# Patient Record
Sex: Female | Born: 1980 | Race: White | Hispanic: Yes | Marital: Single | Smoking: Current every day smoker
Health system: Southern US, Community
[De-identification: ages and names within clinical notes are randomized; demographics above are authoritative.]

## PROBLEM LIST (undated history)

## (undated) HISTORY — PX: TUBAL LIGATION: SHX77

---

## 2007-05-28 ENCOUNTER — Emergency Department (HOSPITAL_COMMUNITY): Admission: EM | Admit: 2007-05-28 | Discharge: 2007-05-28 | Payer: Self-pay | Admitting: Emergency Medicine

## 2008-01-07 ENCOUNTER — Inpatient Hospital Stay (HOSPITAL_COMMUNITY): Admission: AD | Admit: 2008-01-07 | Discharge: 2008-01-07 | Payer: Self-pay | Admitting: Obstetrics & Gynecology

## 2008-02-22 ENCOUNTER — Inpatient Hospital Stay (HOSPITAL_COMMUNITY): Admission: AD | Admit: 2008-02-22 | Discharge: 2008-02-22 | Payer: Self-pay | Admitting: Obstetrics & Gynecology

## 2008-03-23 ENCOUNTER — Ambulatory Visit (HOSPITAL_COMMUNITY): Admission: RE | Admit: 2008-03-23 | Discharge: 2008-03-23 | Payer: Self-pay | Admitting: Family Medicine

## 2008-04-15 ENCOUNTER — Ambulatory Visit (HOSPITAL_COMMUNITY): Admission: RE | Admit: 2008-04-15 | Discharge: 2008-04-15 | Payer: Self-pay | Admitting: Family Medicine

## 2008-05-10 ENCOUNTER — Inpatient Hospital Stay (HOSPITAL_COMMUNITY): Admission: AD | Admit: 2008-05-10 | Discharge: 2008-05-10 | Payer: Self-pay | Admitting: Obstetrics and Gynecology

## 2008-05-10 ENCOUNTER — Ambulatory Visit: Payer: Self-pay | Admitting: Obstetrics and Gynecology

## 2008-05-14 ENCOUNTER — Ambulatory Visit (HOSPITAL_COMMUNITY): Admission: RE | Admit: 2008-05-14 | Discharge: 2008-05-14 | Payer: Self-pay | Admitting: Family Medicine

## 2008-06-11 ENCOUNTER — Ambulatory Visit (HOSPITAL_COMMUNITY): Admission: RE | Admit: 2008-06-11 | Discharge: 2008-06-11 | Payer: Self-pay | Admitting: Family Medicine

## 2008-07-09 ENCOUNTER — Ambulatory Visit (HOSPITAL_COMMUNITY): Admission: RE | Admit: 2008-07-09 | Discharge: 2008-07-09 | Payer: Self-pay | Admitting: Family Medicine

## 2008-07-13 ENCOUNTER — Inpatient Hospital Stay (HOSPITAL_COMMUNITY): Admission: AD | Admit: 2008-07-13 | Discharge: 2008-07-27 | Payer: Self-pay | Admitting: Obstetrics & Gynecology

## 2008-07-13 ENCOUNTER — Ambulatory Visit: Payer: Self-pay | Admitting: Family Medicine

## 2008-08-04 ENCOUNTER — Ambulatory Visit (HOSPITAL_COMMUNITY): Admission: RE | Admit: 2008-08-04 | Discharge: 2008-08-04 | Payer: Self-pay | Admitting: Obstetrics & Gynecology

## 2008-08-06 ENCOUNTER — Ambulatory Visit: Payer: Self-pay | Admitting: Obstetrics & Gynecology

## 2008-08-11 ENCOUNTER — Ambulatory Visit (HOSPITAL_COMMUNITY): Admission: RE | Admit: 2008-08-11 | Discharge: 2008-08-11 | Payer: Self-pay | Admitting: Obstetrics & Gynecology

## 2008-08-13 ENCOUNTER — Ambulatory Visit: Payer: Self-pay | Admitting: Obstetrics & Gynecology

## 2008-08-24 ENCOUNTER — Ambulatory Visit: Payer: Self-pay | Admitting: Obstetrics & Gynecology

## 2008-09-03 ENCOUNTER — Ambulatory Visit: Payer: Self-pay | Admitting: Obstetrics & Gynecology

## 2008-09-07 ENCOUNTER — Ambulatory Visit: Payer: Self-pay | Admitting: Obstetrics & Gynecology

## 2008-09-14 ENCOUNTER — Ambulatory Visit: Payer: Self-pay | Admitting: Obstetrics & Gynecology

## 2008-09-22 ENCOUNTER — Ambulatory Visit: Payer: Self-pay | Admitting: Obstetrics & Gynecology

## 2008-09-22 ENCOUNTER — Inpatient Hospital Stay (HOSPITAL_COMMUNITY): Admission: RE | Admit: 2008-09-22 | Discharge: 2008-09-25 | Payer: Self-pay | Admitting: Gynecology

## 2008-10-29 ENCOUNTER — Ambulatory Visit: Payer: Self-pay | Admitting: Family Medicine

## 2009-09-18 IMAGING — US US OB DETAIL+14 WK
1 series · 3 of 3 positions shown · non-contrast
Comparison: none

OBSTETRICAL ULTRASOUND:
 This ultrasound exam was performed in the [HOSPITAL] Ultrasound Department.  The OB US report was generated in the AS system, and faxed to the ordering physician.  This report is also available in [REDACTED] PACS.

[Series 1: us ob detail+14 wk · 0.11mm/px · 3 of 3 slices shown]
[im 1/3]
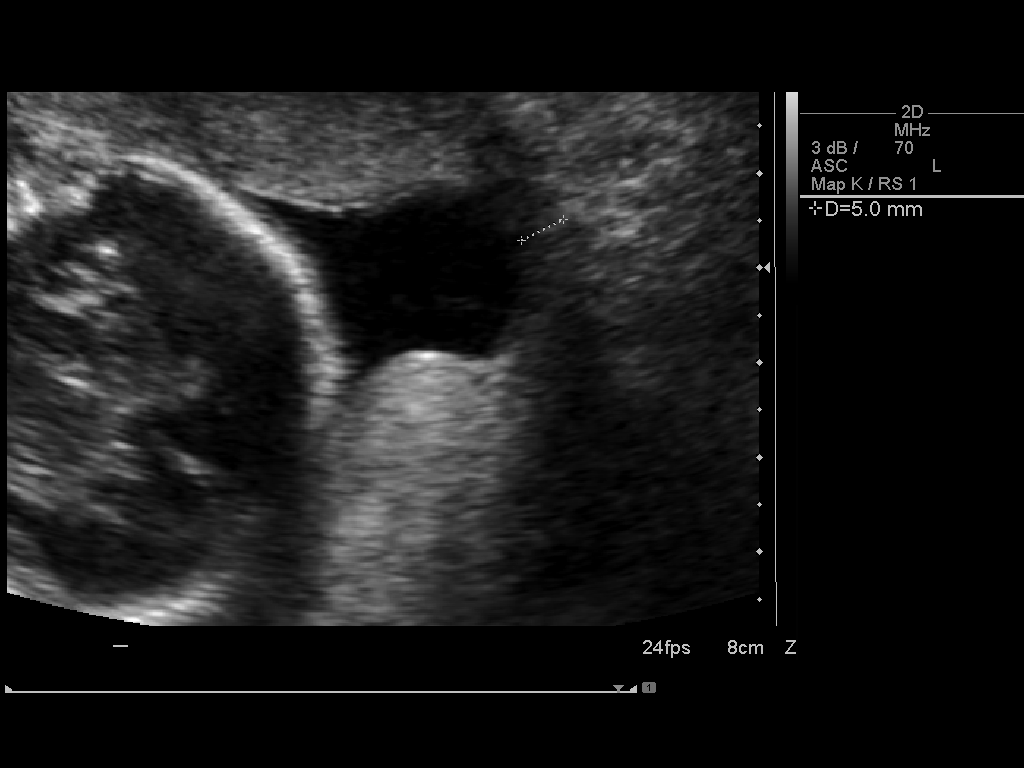
[im 2/3]
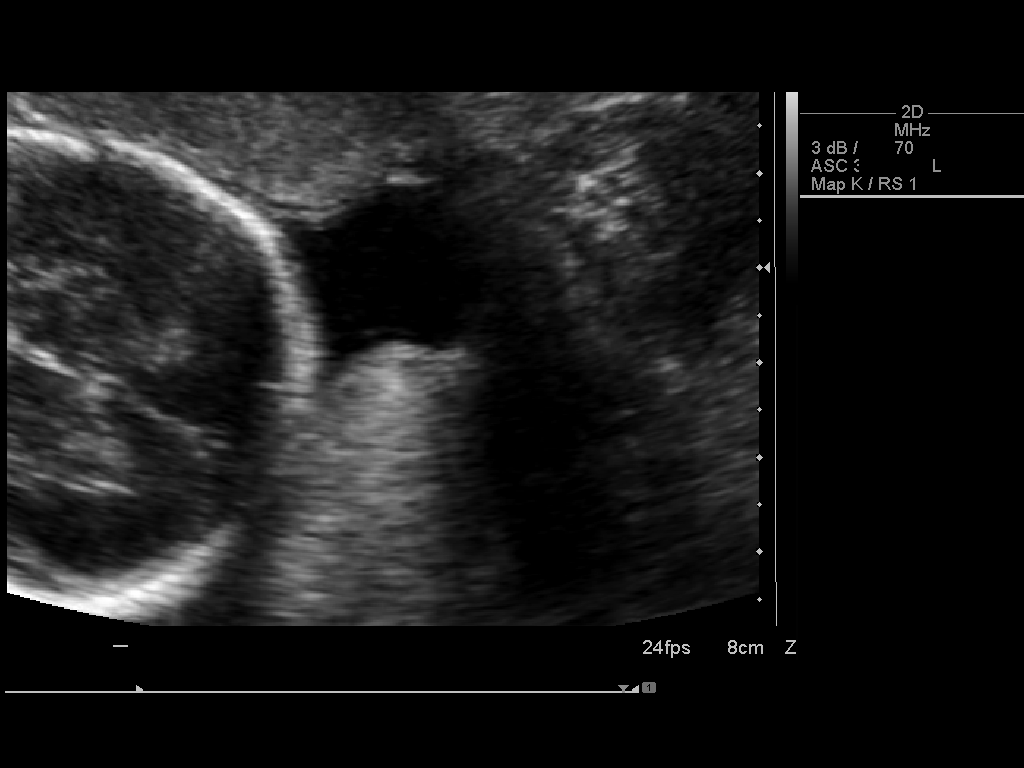
[im 3/3]
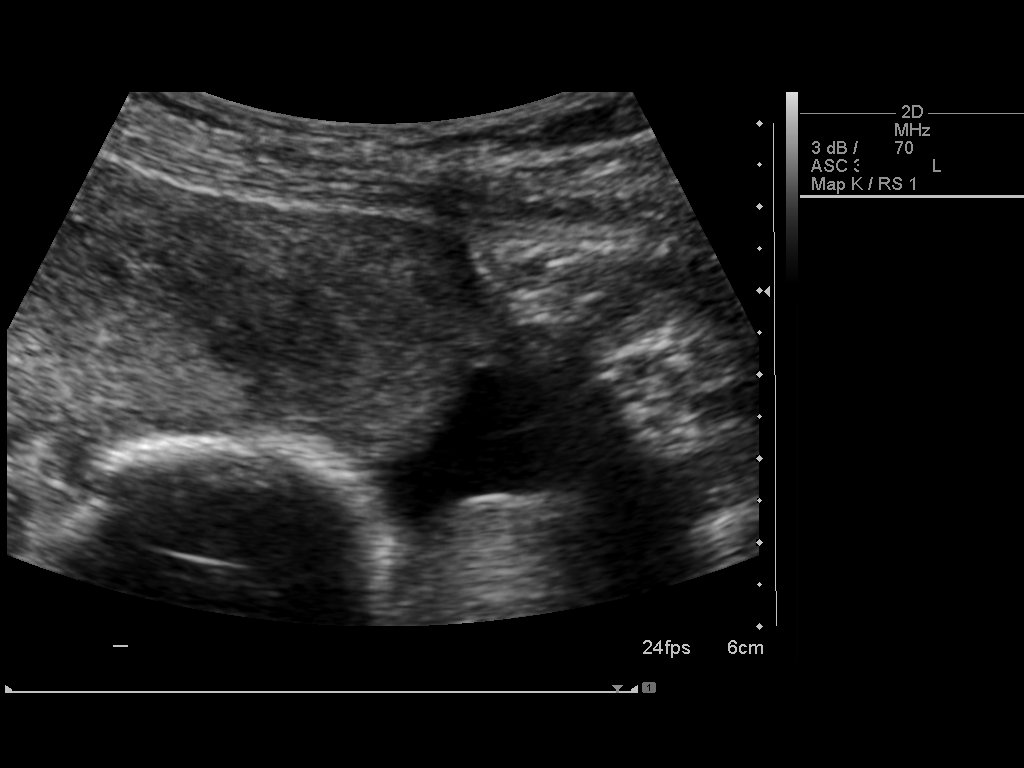

[3 of 3 positions shown; findings below may reference images not displayed]

IMPRESSION: See AS Obstetric US report.

## 2009-11-13 IMAGING — US US OB FOLLOW-UP
1 series · 14 of 28 positions shown · non-contrast
Comparison: none

OBSTETRICAL ULTRASOUND:
 This ultrasound exam was performed in the [HOSPITAL] Ultrasound Department.  The OB US report was generated in the AS system, and faxed to the ordering physician.  This report is also available in [REDACTED] PACS.

[Series 1: us ob follow-up · 0.28mm/px · 14 of 43 slices shown]
[im 2/43]
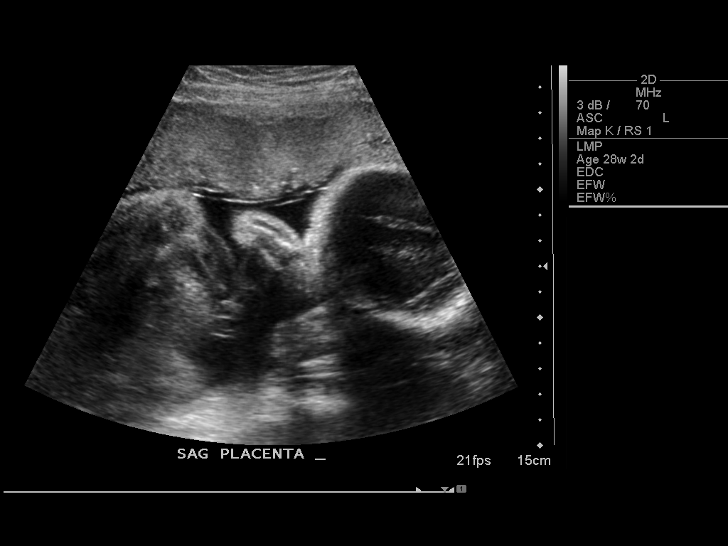
[im 5/43]
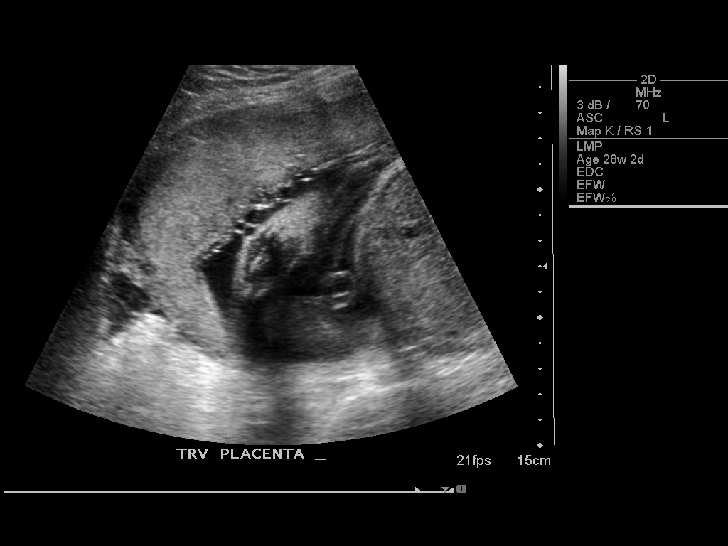
[im 8/43]
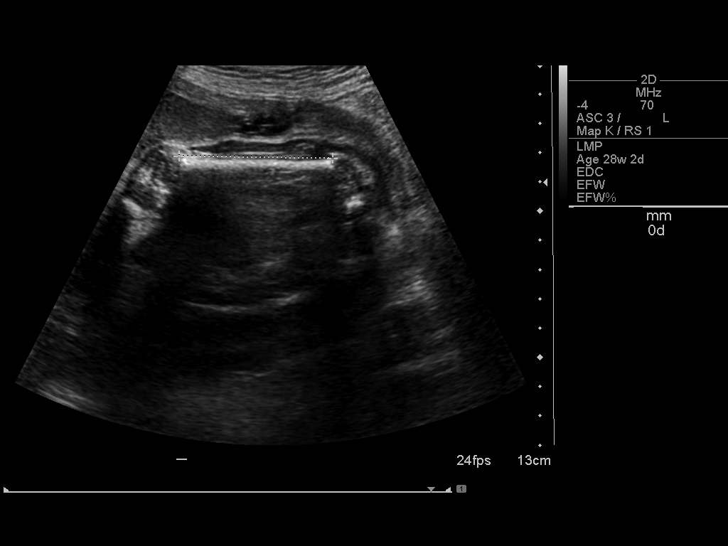
[im 11/43]
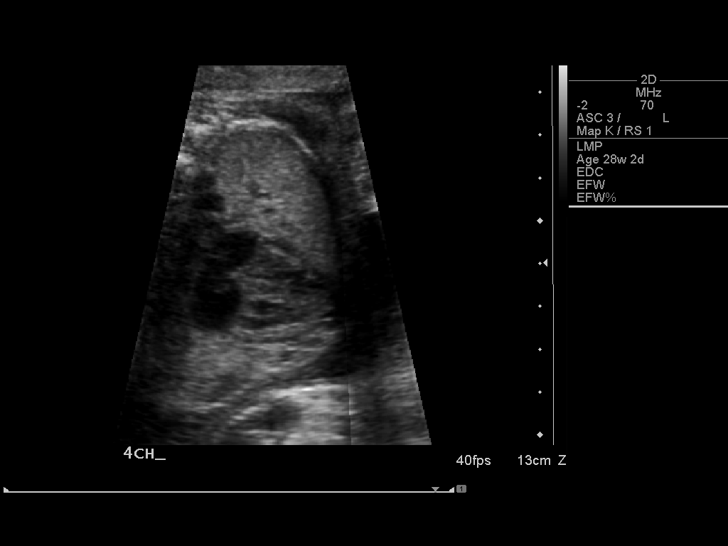
[im 15/43]
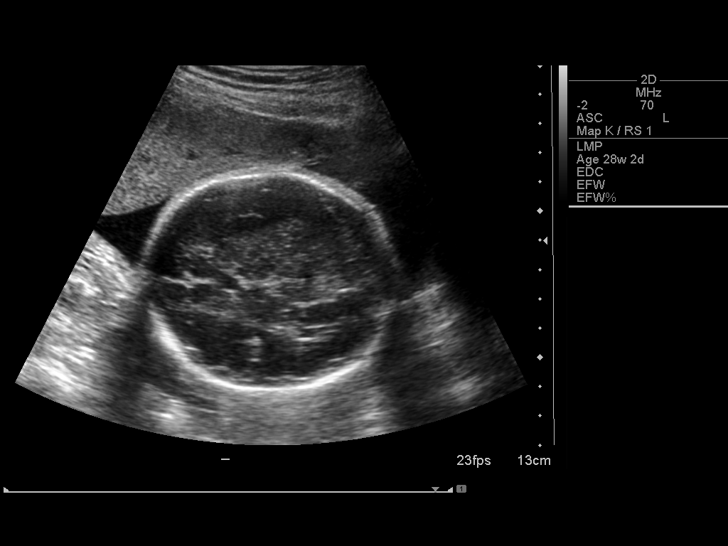
[im 18/43]
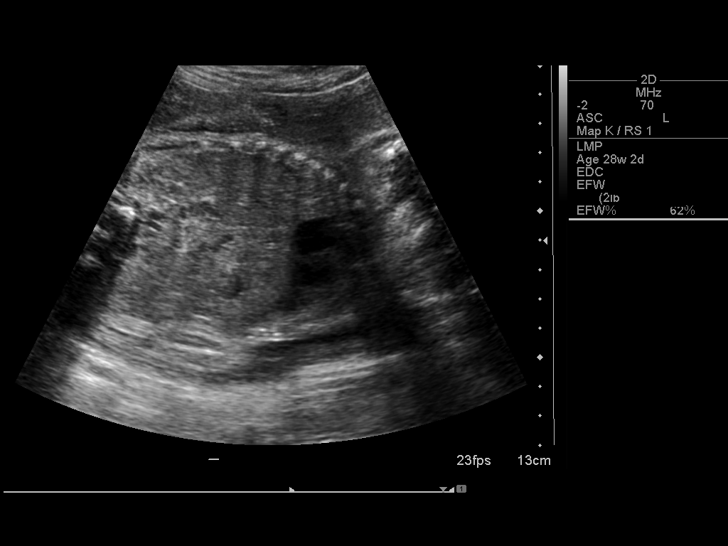
[im 21/43]
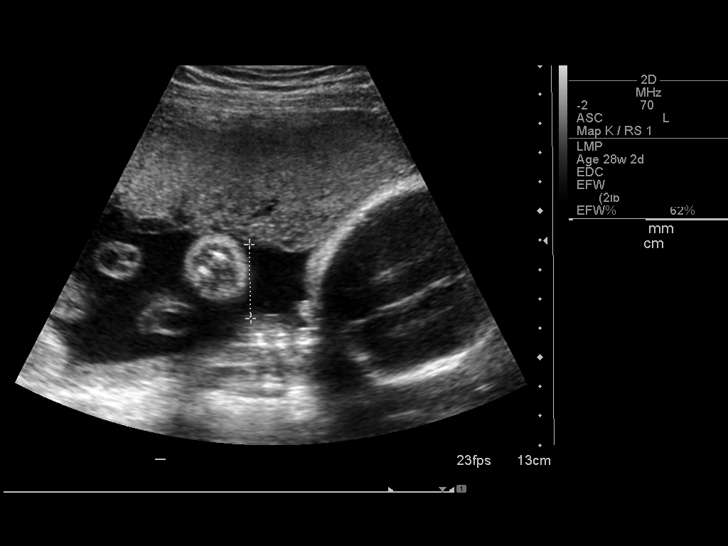
[im 24/43]
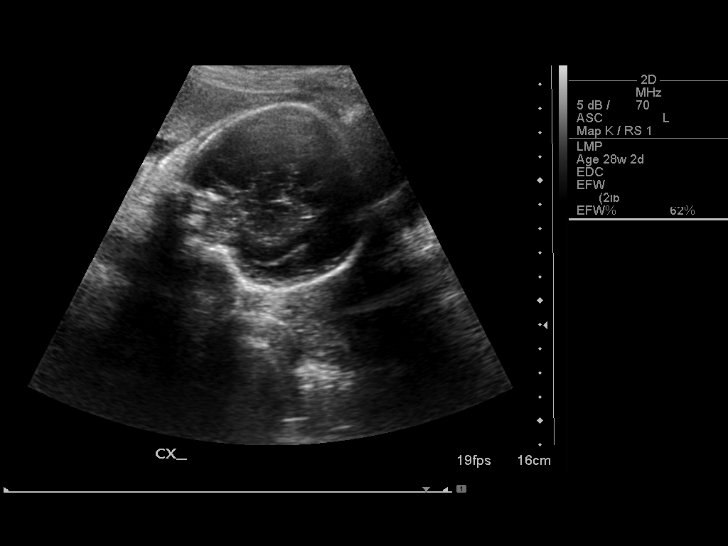
[im 27/43]
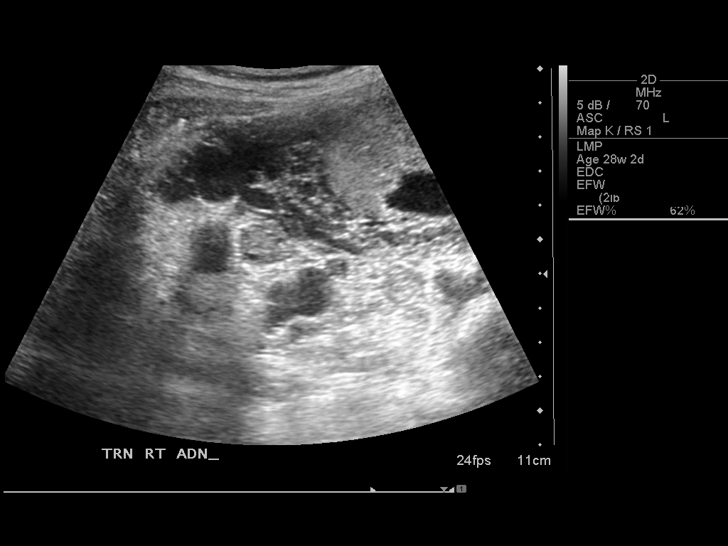
[im 30/43]
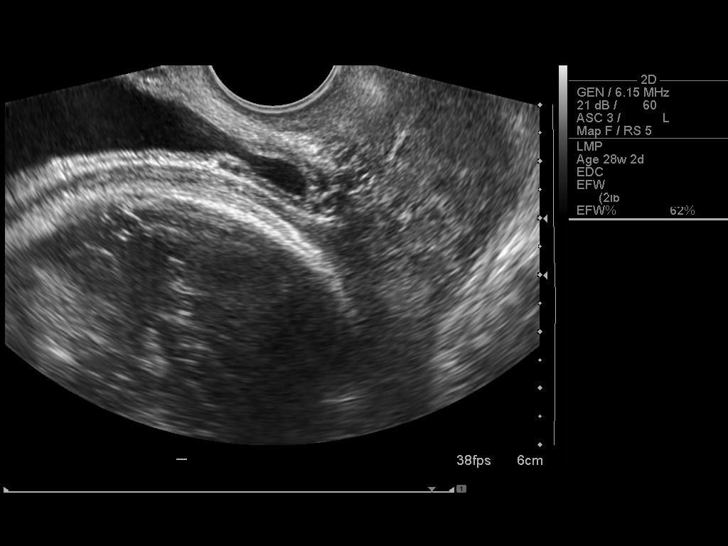
[im 33/43]
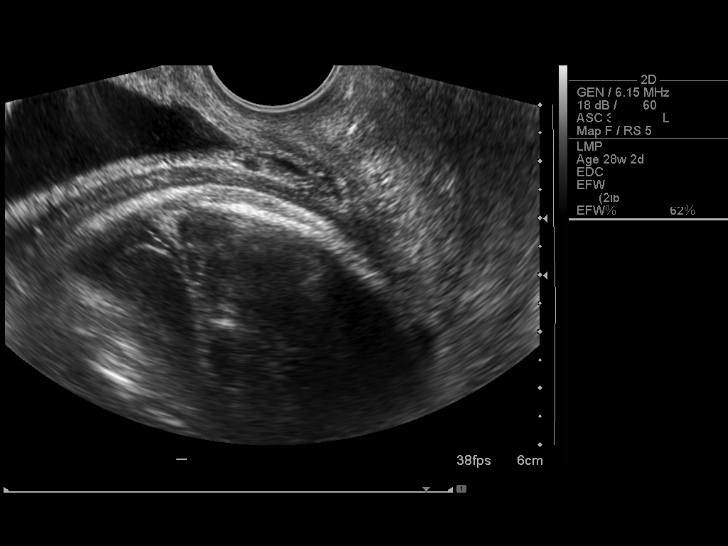
[im 36/43]
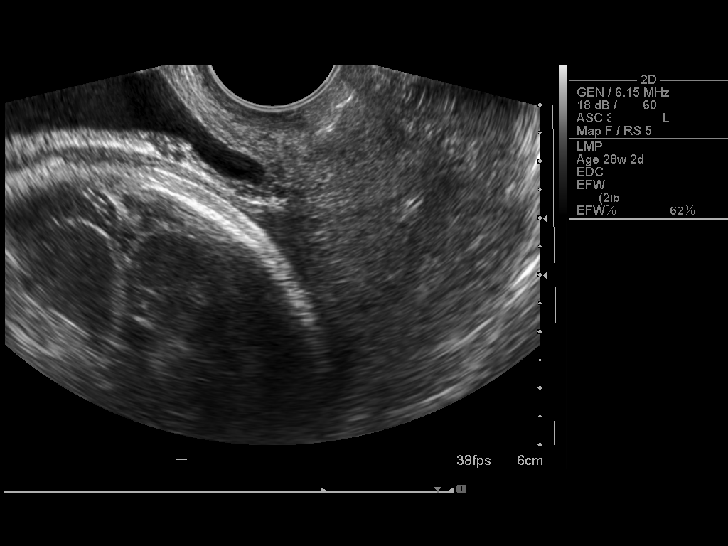
[im 39/43]
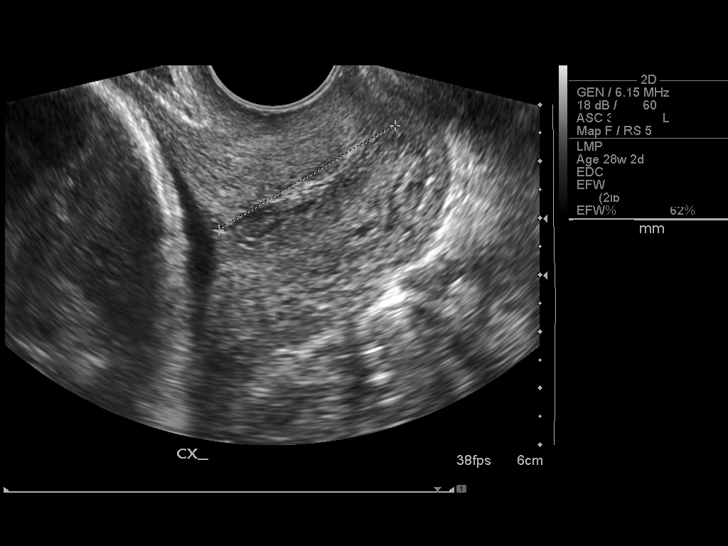
[im 43/43]
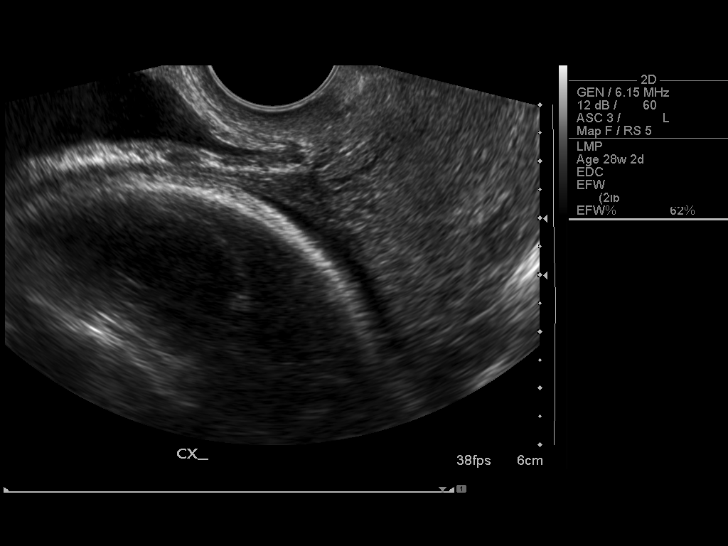

[14 of 28 positions shown; findings below may reference images not displayed]

IMPRESSION: See AS Obstetric US report.

## 2010-01-20 ENCOUNTER — Emergency Department (HOSPITAL_COMMUNITY): Admission: EM | Admit: 2010-01-20 | Discharge: 2010-01-20 | Payer: Self-pay | Admitting: Emergency Medicine

## 2010-10-24 ENCOUNTER — Inpatient Hospital Stay (HOSPITAL_COMMUNITY): Admission: AD | Admit: 2010-10-24 | Discharge: 2010-10-24 | Payer: Self-pay | Admitting: Obstetrics and Gynecology

## 2010-10-24 ENCOUNTER — Ambulatory Visit: Payer: Self-pay | Admitting: Nurse Practitioner

## 2011-03-08 LAB — GC/CHLAMYDIA PROBE AMP, GENITAL
Chlamydia, DNA Probe: NEGATIVE
GC Probe Amp, Genital: NEGATIVE

## 2011-03-08 LAB — CBC
Hemoglobin: 13 g/dL (ref 12.0–15.0)
MCHC: 33.3 g/dL (ref 30.0–36.0)
WBC: 8.9 10*3/uL (ref 4.0–10.5)

## 2011-03-08 LAB — URINALYSIS, ROUTINE W REFLEX MICROSCOPIC
Bilirubin Urine: NEGATIVE
Glucose, UA: NEGATIVE mg/dL
Hgb urine dipstick: NEGATIVE
Ketones, ur: NEGATIVE mg/dL
Nitrite: NEGATIVE
Protein, ur: NEGATIVE mg/dL
Specific Gravity, Urine: 1.025 (ref 1.005–1.030)
Urobilinogen, UA: 1 mg/dL (ref 0.0–1.0)
pH: 6.5 (ref 5.0–8.0)

## 2011-03-08 LAB — POCT PREGNANCY, URINE: Preg Test, Ur: NEGATIVE

## 2011-03-08 LAB — WET PREP, GENITAL: Yeast Wet Prep HPF POC: NONE SEEN

## 2011-03-12 LAB — URINE MICROSCOPIC-ADD ON

## 2011-03-12 LAB — COMPREHENSIVE METABOLIC PANEL
Albumin: 4.3 g/dL (ref 3.5–5.2)
BUN: 13 mg/dL (ref 6–23)
Calcium: 9.1 mg/dL (ref 8.4–10.5)
Chloride: 102 mEq/L (ref 96–112)
Creatinine, Ser: 0.88 mg/dL (ref 0.4–1.2)
Total Bilirubin: 1.5 mg/dL — ABNORMAL HIGH (ref 0.3–1.2)
Total Protein: 7.7 g/dL (ref 6.0–8.3)

## 2011-03-12 LAB — DIFFERENTIAL
Basophils Absolute: 0 10*3/uL (ref 0.0–0.1)
Lymphocytes Relative: 17 % (ref 12–46)
Monocytes Absolute: 0.3 10*3/uL (ref 0.1–1.0)
Neutro Abs: 5.9 10*3/uL (ref 1.7–7.7)

## 2011-03-12 LAB — CBC
HCT: 40.8 % (ref 36.0–46.0)
Hemoglobin: 13.7 g/dL (ref 12.0–15.0)
MCHC: 33.6 g/dL (ref 30.0–36.0)
RDW: 13.8 % (ref 11.5–15.5)

## 2011-03-12 LAB — URINE CULTURE: Colony Count: 90000

## 2011-03-12 LAB — URINALYSIS, ROUTINE W REFLEX MICROSCOPIC
Glucose, UA: NEGATIVE mg/dL
Protein, ur: NEGATIVE mg/dL
Urobilinogen, UA: 1 mg/dL (ref 0.0–1.0)

## 2011-03-12 LAB — LIPASE, BLOOD: Lipase: 20 U/L (ref 11–59)

## 2011-05-09 NOTE — Discharge Summary (Signed)
Priscilla Russell, Priscilla Russell NO.:  [id number]   MEDICAL RECORD NO.:  [id number]          PATIENT TYPE:  INP   LOCATION:  9156                          FACILITY:  [facility]   PHYSICIAN:  Odie Sera, DO      DATE OF BIRTH:  [birth year]   DATE OF ADMISSION:  07/13/2008  DATE OF DISCHARGE:  07/27/2008                               DISCHARGE SUMMARY   REASON FOR HOSPITALIZATION:  Preterm premature rupture of membranes at  71 weeks' gestational age.   HOSPITAL COURSE:  Priscilla Russell is a 30 year old gravida 3, para 1-0-1-1  at 29 weeks' gestation upon admission.  She was admitted for suspected  rupture of membranes.  Testing at that time revealed Nitrazine positive  and ferning positive as well as an AFI on a low side at 9.1.  A fetal  fibronectin test also at the time of admission was positive giving [place] a  high suspicion that the sheath in her membranes have been ruptured.  Shortly after admission, it was revealed that she had a positive  gonorrhea test, further giving [place] reason for a rupture of membranes.  She was treated for the gonorrhea as well as treatment for Chlamydia.  During her hospital course, she remained stable.  She was afebrile the  entire time, her fetal heart rate tracing was consistently reactive, and  she had minimal to no uterine contractions.  She related one day where  she suspected she may have had some leakage of fluid, but other than  that, she had reported no other episodes of leakage of fluid.  She  completed a course of antibiotics, 2 days of IV ampicillin and  azithromycin, which was then switched to p.o. amoxicillin and  azithromycin.  A total of 7-day course was completed.  She also received  a 2 day course of betamethasone.   Other pertinent labs revealed that her group B strep test was negative.  After a 2-week hospitalization, an AFI was repeated and found to be  reported as 9.6.  Repeat tests for ferning was negative as well as  Nitrazine.  A  fetal fibronectin test repeated again also was negative.  Based on these findings, it was determined that she was no longer  leaking fluid, and this was discussed with the patient.  A dye test was  offered.  However, given the above findings, there is a high likelihood  that she was no longer leaking fluid.  The patient declined the dye  test, and she agreed with being discharged.   PERTINENT LABORATORY DATA:  As described above, she had a positive  gonorrhea test and negative group B strep test.   FINAL DIAGNOSIS:  Premature preterm rupture of membranes, intrauterine  pregnancy.   CONDITION AT DISCHARGE:  Good.   Instructions given to the patient: itis recommended that she not return  to work for at least 1-2 weeks pending further followup.  A followup  appointment was scheduled [facility] on Thursday, August 06, 2008, at 9:45 a.m.  She also has an appointment to see Dr. Ander Slade for  further ultrasound testing on August 04, 2008.  She is to continue  taking her prenatal vitamins.  No other medications were prescribed at  this time.     Odie Sera, DO  Electronically Signed    MC/MEDQ  D:  07/27/2008  T:  07/28/2008  Job:  [id number]

## 2011-05-09 NOTE — Discharge Summary (Signed)
NAMEIMOJEAN, YOSHINO Priscilla.:  [id number]   MEDICAL RECORD Priscilla.:  [id number]           PATIENT TYPE:   LOCATION:                                 FACILITY:   PHYSICIAN:  Lesly Dukes, M.D. DATE OF BIRTH:  10/30/1981   DATE OF ADMISSION:  09/22/2008  DATE OF DISCHARGE:  09/25/2008                               DISCHARGE SUMMARY   DIAGNOSIS AT ADMISSION:  Term pregnancy.   DIAGNOSIS AT DISCHARGE:  Status post delivery of term baby via low-  transverse cesarean section with bilateral tubal ligation.   PROCEDURES DONE DURING ADMISSION:  Ultrasound, low-transverse cesarean  section, and bilateral tubal ligation.   HISTORY AND HOSPITAL COURSE:  The patient is a 30 year old G3, P2-0-1-2  with repeat low-transverse cesarean section post bilateral tubal  ligation on September 22, 2008 done by Dr. Marice Potter and Dr. Tawanna Cooler, gave birth  to a healthy female boy at 66 weeks with Apgars of 9 at one and 9 at five.  Estimated blood loss during the procedure was 650 mL.  The patient is  blood type O positive, rubella immune, HIV nonreactive.  The patient  following a progressive course of improvement on the floor and by  discharge was eating, passing gas, and ambulatory and with only a small  amount of serosanguineous drainage from vagina.  Incision was clean,  dry, and intact and staples had been removed.   DISCHARGE CONDITION:  Stable.   DISPOSITION:  The patient will be discharged to home.  Followup will be  in 6 weeks at [facility] Department.   DISCHARGE INSTRUCTIONS:  The patient will be instructed to do pelvic  rest and Priscilla heavy lifting for 6 weeks.  Hemoglobin at discharge was 9.9.   MEDICATIONS AT DISCHARGE:  1. Vicodin 5/500 mg 1-2 tabs p.o. q.6 h. p.r.n. pain.  2. Ibuprofen 600 mg p.o. q.6 h. p.r.n. pain.  3. Colace 100 mg p.o. b.i.d. p.r.n. constipation.  4. Iron sulfate 325 mg p.o. b.i.d.   Note, the baby will stay in the hospital secondary to tachypnea  and to  the ED.      Rodney Langton, MD      Lesly Dukes, M.D.  Electronically Signed    TT/MEDQ  D:  09/25/2008  T:  09/25/2008  Job:  [id number]

## 2011-05-09 NOTE — Op Note (Signed)
Priscilla Russell, Priscilla Russell                ACCOUNT NO.:  [id number]   MEDICAL RECORD NO.:  [id number]          PATIENT TYPE:  INP   LOCATION:  9103                          FACILITY:  [facility]   PHYSICIAN:  Allie Bossier, MD        DATE OF BIRTH:  Nov 15, 1981   DATE OF PROCEDURE:  09/22/2008  DATE OF DISCHARGE:                               OPERATIVE REPORT   PREOPERATIVE DIAGNOSES:  1. Intrauterine pregnancy at term.  2. History of previous cesarean section.  3. Undesired fertility.   POSTOPERATIVE DIAGNOSES:  1. Intrauterine pregnancy at term.  2. History of previous cesarean section.  3. Undesired fertility.   PROCEDURES:  1. Repeat low-transverse cesarean section.  2. Bilateral tubal ligation with Filshie clips.   SURGEON:  Allie Bossier, MD   ASSISTANT:  Odie Sera, DO   ANESTHESIA:  Spinal.   INDICATION FOR PROCEDURE:  [name] is a 30 year old gravida 3,  para 1-0-1-1, 29 weeks' gestational age by early ultrasound with history  of prior low-transverse C-section, and desires an elective repeat  cesarean section.  Additionally, she desires sterilization procedure and  understands the risk of failure associated with that procedure.   OPERATIVE PROCEDURE:  The patient was taken to the operating room where  spinal anesthesia was administered.  A time-out was conducted.  The  patient was prepped and draped in usual sterile manner.  Appropriate  anesthesia was confirmed.  The patient was placed in the left dorsal  supine position and a low-transverse Pfannenstiel incision was made the  scalpel.  The incision was extended down to the fascia with the scalpel.  Then fascia was incised in the midline with a scalpel.  The fascial  incision was extended laterally left and right with the scissors.  The  rectus muscles were dissected by electrocautery, approximate one-third  of way across roughly at the midline.  The peritoneum was entered  bluntly with forceps.  The peritoneal opening  was extended with  electrocautery and manual traction.  The bladder blade was placed and a  low-transverse uterine incision was made with a scalpel.  It was  extended down to the myometrium and the last layers were entered bluntly  with forceps.  The amnion was punctured with forceps and clear fluid was  noted.  The uterine incision was extended with manual traction.  The  fetus head was easily located, elevated, and the baby was delivered out  of the uterus with fundal pressure.  Baby was bulb suctioned, had a  spontaneous cry.  The cord was clamped and cut x2 and the baby was  handed to the waiting NICU team.  Baby Apgars were 9 and 9.  The uterine  incision was closed with 0 Vicryl and in the usual manner.  Good  hemostasis was noted.  Attention was then brought to the fallopian  tubes.  The left fallopian tube was identified and followed distally up  to the fimbria.  A Filshie clip was placed approximately 2 cm from the  cornual uterus.  The right fallopian tube was then identified  and  followed distally to the fimbria.  A Filshie clip was placed  approximately 2 cm from the cornu of the uterus on the right side.  Good  hemostasis was noted, and both tubes were returned to the abdomen.  Good  hemostasis again was noted on the uterine incision.  The fascia was  closed with 0 PDS loop suture in a running noninterlocking manner.  Good  hemostasis noted.  No defects were noted in the fascia.  The  subcutaneous tissues were irrigated with water and the skin was closed  with a running subcuticular suture of 3-0 Vicryl.  Pressure dressing was  applied.  The patient was taken to the PACU in good condition.   FINDINGS:  1. Viable female infant with no gross abnormalities, weight 7 pounds 4      ounces, Apgars 9 and 9.  2. Normal bilateral adnexa.   SPECIMENS:  1. Intact placenta.  2. Cord blood specimens.   DISPOSITION:  Placenta to L&D, cord blood to the lab.   ESTIMATED BLOOD LOSS:   650 mL.   COMPLICATIONS:  None immediate.      Odie Sera, DO  Electronically Signed     ______________________________  Allie Bossier, MD    MC/MEDQ  D:  09/22/2008  T:  09/23/2008  Job:  [id number]

## 2011-06-11 ENCOUNTER — Emergency Department (HOSPITAL_COMMUNITY)
Admission: EM | Admit: 2011-06-11 | Discharge: 2011-06-11 | Disposition: A | Discharge status: Home / Self Care | Payer: Self-pay | Attending: Emergency Medicine | Admitting: Emergency Medicine

## 2011-06-11 ENCOUNTER — Emergency Department (HOSPITAL_COMMUNITY): Payer: Self-pay

## 2011-06-11 DIAGNOSIS — R059 Cough, unspecified: Secondary | ICD-10-CM | POA: Insufficient documentation

## 2011-06-11 DIAGNOSIS — R112 Nausea with vomiting, unspecified: Secondary | ICD-10-CM | POA: Insufficient documentation

## 2011-06-11 DIAGNOSIS — R197 Diarrhea, unspecified: Secondary | ICD-10-CM | POA: Insufficient documentation

## 2011-06-11 DIAGNOSIS — R05 Cough: Secondary | ICD-10-CM | POA: Insufficient documentation

## 2011-06-11 LAB — URINALYSIS, ROUTINE W REFLEX MICROSCOPIC
Bilirubin Urine: NEGATIVE
Hgb urine dipstick: NEGATIVE
Urobilinogen, UA: 1 mg/dL (ref 0.0–1.0)

## 2011-06-11 LAB — CBC
HCT: 38.5 % (ref 36.0–46.0)
Hemoglobin: 13.3 g/dL (ref 12.0–15.0)
MCH: 27.9 pg (ref 26.0–34.0)
RBC: 4.76 MIL/uL (ref 3.87–5.11)

## 2011-06-11 LAB — COMPREHENSIVE METABOLIC PANEL
ALT: 13 U/L (ref 0–35)
Alkaline Phosphatase: 64 U/L (ref 39–117)
BUN: 9 mg/dL (ref 6–23)
CO2: 29 mEq/L (ref 19–32)
Calcium: 8.8 mg/dL (ref 8.4–10.5)
GFR calc Af Amer: >60 mL/min (ref >60)
GFR calc non Af Amer: >60 mL/min (ref >60)
Glucose, Bld: 85 mg/dL (ref 70–99)
Sodium: 138 mEq/L (ref 135–145)

## 2011-06-11 LAB — DIFFERENTIAL
Basophils Relative: 0 % (ref 0–1)
Lymphocytes Relative: 19 % (ref 12–46)
Lymphs Abs: 1.7 10*3/uL (ref 0.7–4.0)
Monocytes Absolute: 0.6 10*3/uL (ref 0.1–1.0)
Monocytes Relative: 6 % (ref 3–12)
Neutro Abs: 6.7 10*3/uL (ref 1.7–7.7)
Neutrophils Relative %: 73 % (ref 43–77)

## 2011-06-11 LAB — LIPASE, BLOOD: Lipase: 24 U/L (ref 11–59)

## 2011-06-11 LAB — POCT PREGNANCY, URINE: Preg Test, Ur: NEGATIVE

## 2011-06-12 ENCOUNTER — Emergency Department (HOSPITAL_COMMUNITY): Payer: Self-pay

## 2011-06-12 ENCOUNTER — Emergency Department (HOSPITAL_COMMUNITY)
Admission: EM | Admit: 2011-06-12 | Discharge: 2011-06-12 | Disposition: A | Discharge status: Home / Self Care | Payer: Self-pay | Attending: Emergency Medicine | Admitting: Emergency Medicine

## 2011-06-12 DIAGNOSIS — R079 Chest pain, unspecified: Secondary | ICD-10-CM | POA: Insufficient documentation

## 2011-06-12 DIAGNOSIS — R05 Cough: Secondary | ICD-10-CM | POA: Insufficient documentation

## 2011-06-12 DIAGNOSIS — R197 Diarrhea, unspecified: Secondary | ICD-10-CM | POA: Insufficient documentation

## 2011-06-12 DIAGNOSIS — R112 Nausea with vomiting, unspecified: Secondary | ICD-10-CM | POA: Insufficient documentation

## 2011-06-12 DIAGNOSIS — R059 Cough, unspecified: Secondary | ICD-10-CM | POA: Insufficient documentation

## 2011-06-12 LAB — CBC
MCHC: 33 g/dL (ref 30.0–36.0)
Platelets: 218 10*3/uL (ref 150–400)
RDW: 13.1 % (ref 11.5–15.5)

## 2011-06-12 LAB — DIFFERENTIAL
Basophils Absolute: 0 10*3/uL (ref 0.0–0.1)
Basophils Relative: 0 % (ref 0–1)
Eosinophils Relative: 1 % (ref 0–5)
Monocytes Absolute: 0.5 10*3/uL (ref 0.1–1.0)
Neutro Abs: 5.1 10*3/uL (ref 1.7–7.7)

## 2011-06-12 LAB — URINE MICROSCOPIC-ADD ON

## 2011-06-12 LAB — URINALYSIS, ROUTINE W REFLEX MICROSCOPIC
Glucose, UA: NEGATIVE mg/dL
Specific Gravity, Urine: 1.03 (ref 1.005–1.030)
Urobilinogen, UA: >8 mg/dL — ABNORMAL HIGH (ref 0.0–1.0)

## 2011-06-12 LAB — COMPREHENSIVE METABOLIC PANEL
ALT: 13 U/L (ref 0–35)
AST: 19 U/L (ref 0–37)
Albumin: 3.9 g/dL (ref 3.5–5.2)
Alkaline Phosphatase: 64 U/L (ref 39–117)
Potassium: 3.6 mEq/L (ref 3.5–5.1)
Sodium: 139 mEq/L (ref 135–145)
Total Protein: 7.8 g/dL (ref 6.0–8.3)

## 2011-06-12 LAB — LIPASE, BLOOD: Lipase: 48 U/L (ref 11–59)

## 2011-09-14 LAB — WET PREP, GENITAL: Trich, Wet Prep: NONE SEEN

## 2011-09-14 LAB — CBC
RBC: 4.45
WBC: 5.8

## 2011-09-14 LAB — URINALYSIS, ROUTINE W REFLEX MICROSCOPIC
Glucose, UA: NEGATIVE
Ketones, ur: NEGATIVE
Protein, ur: NEGATIVE
Urobilinogen, UA: 1

## 2011-09-15 LAB — GC/CHLAMYDIA PROBE AMP, GENITAL
Chlamydia, DNA Probe: NEGATIVE
GC Probe Amp, Genital: NEGATIVE

## 2011-09-15 LAB — URINALYSIS, ROUTINE W REFLEX MICROSCOPIC
Nitrite: NEGATIVE
Specific Gravity, Urine: 1.02
Urobilinogen, UA: 1

## 2011-09-15 LAB — CBC
HCT: 34.7 — ABNORMAL LOW
Hemoglobin: 11.9 — ABNORMAL LOW
MCHC: 34.2
RDW: 12.8

## 2011-09-15 LAB — POCT PREGNANCY, URINE
Operator id: 208801
Preg Test, Ur: POSITIVE

## 2011-09-20 LAB — URINALYSIS, ROUTINE W REFLEX MICROSCOPIC
Bilirubin Urine: NEGATIVE
Glucose, UA: NEGATIVE
Ketones, ur: NEGATIVE
pH: 7

## 2011-09-22 LAB — URINALYSIS, ROUTINE W REFLEX MICROSCOPIC
Bilirubin Urine: NEGATIVE
Nitrite: NEGATIVE
Specific Gravity, Urine: 1.02
pH: 6.5

## 2011-09-22 LAB — DIFFERENTIAL
Lymphocytes Relative: 19
Lymphs Abs: 2.1
Monocytes Absolute: 0.7
Monocytes Relative: 6
Neutro Abs: 8.3 — ABNORMAL HIGH

## 2011-09-22 LAB — POCT URINALYSIS DIP (DEVICE)
Bilirubin Urine: NEGATIVE
Glucose, UA: NEGATIVE
Hgb urine dipstick: NEGATIVE
Operator id: 159681
Specific Gravity, Urine: 1.025

## 2011-09-22 LAB — FETAL FIBRONECTIN: Fetal Fibronectin: POSITIVE

## 2011-09-22 LAB — GC/CHLAMYDIA PROBE AMP, GENITAL
Chlamydia, DNA Probe: NEGATIVE
GC Probe Amp, Genital: POSITIVE — AB

## 2011-09-22 LAB — STREP B DNA PROBE

## 2011-09-22 LAB — CBC
Hemoglobin: 11.1 — ABNORMAL LOW
RBC: 3.68 — ABNORMAL LOW
WBC: 11.1 — ABNORMAL HIGH

## 2011-09-22 LAB — WET PREP, GENITAL: Yeast Wet Prep HPF POC: NONE SEEN

## 2011-09-25 LAB — CBC
HCT: 30.3 — ABNORMAL LOW
HCT: 30.2 — ABNORMAL LOW
Hemoglobin: 10.2 — ABNORMAL LOW
Hemoglobin: 11.5 — ABNORMAL LOW
Hemoglobin: 9.9 — ABNORMAL LOW
MCHC: 33.6
MCHC: 32.9
MCV: 88.4
MCV: 89
Platelets: 185
Platelets: 205
RBC: 3.85 — ABNORMAL LOW
RDW: 14
WBC: 11.1 — ABNORMAL HIGH
WBC: 9.4

## 2011-09-25 LAB — POCT URINALYSIS DIP (DEVICE)
Bilirubin Urine: NEGATIVE
Glucose, UA: NEGATIVE
Hgb urine dipstick: NEGATIVE
Nitrite: NEGATIVE
Operator id: 194561
Specific Gravity, Urine: 1.02

## 2011-09-25 LAB — RPR: RPR Ser Ql: NONREACTIVE

## 2011-09-27 LAB — POCT URINALYSIS DIP (DEVICE)
Bilirubin Urine: NEGATIVE
Glucose, UA: NEGATIVE
Hgb urine dipstick: NEGATIVE
Ketones, ur: NEGATIVE
Nitrite: NEGATIVE
Operator id: 297281
Protein, ur: NEGATIVE
Specific Gravity, Urine: 1.015
Urobilinogen, UA: 0.2
pH: 7

## 2011-10-12 LAB — POCT URINALYSIS DIP (DEVICE)
Bilirubin Urine: NEGATIVE
Glucose, UA: NEGATIVE
Hgb urine dipstick: NEGATIVE
Ketones, ur: NEGATIVE
Specific Gravity, Urine: 1.02

## 2011-10-12 LAB — WET PREP, GENITAL
Clue Cells Wet Prep HPF POC: NONE SEEN
Yeast Wet Prep HPF POC: NONE SEEN

## 2011-10-12 LAB — GC/CHLAMYDIA PROBE AMP, GENITAL: Chlamydia, DNA Probe: NEGATIVE

## 2012-08-08 ENCOUNTER — Emergency Department (HOSPITAL_COMMUNITY)
Admission: EM | Admit: 2012-08-08 | Discharge: 2012-08-08 | Disposition: A | Discharge status: Home / Self Care | Payer: Self-pay | Attending: Emergency Medicine | Admitting: Emergency Medicine

## 2012-08-08 ENCOUNTER — Encounter (HOSPITAL_COMMUNITY): Payer: Self-pay | Admitting: Emergency Medicine

## 2012-08-08 DIAGNOSIS — F17200 Nicotine dependence, unspecified, uncomplicated: Secondary | ICD-10-CM | POA: Insufficient documentation

## 2012-08-08 DIAGNOSIS — R1110 Vomiting, unspecified: Secondary | ICD-10-CM | POA: Insufficient documentation

## 2012-08-08 DIAGNOSIS — J029 Acute pharyngitis, unspecified: Secondary | ICD-10-CM | POA: Insufficient documentation

## 2012-08-08 LAB — CBC WITH DIFFERENTIAL/PLATELET
Basophils Relative: 0 % (ref 0–1)
Eosinophils Absolute: 0 10*3/uL (ref 0.0–0.7)
Eosinophils Relative: 0 % (ref 0–5)
MCH: 27.4 pg (ref 26.0–34.0)
MCHC: 34 g/dL (ref 30.0–36.0)
Monocytes Relative: 5 % (ref 3–12)
Neutrophils Relative %: 64 % (ref 43–77)
Platelets: 200 10*3/uL (ref 150–400)

## 2012-08-08 LAB — BASIC METABOLIC PANEL
BUN: 11 mg/dL (ref 6–23)
Calcium: 9.3 mg/dL (ref 8.4–10.5)
GFR calc Af Amer: >90 mL/min (ref >90)
GFR calc non Af Amer: >90 mL/min (ref >90)
Potassium: 3.6 mEq/L (ref 3.5–5.1)
Sodium: 138 mEq/L (ref 135–145)

## 2012-08-08 MED ORDER — GI COCKTAIL ~~LOC~~
30.0000 mL | Freq: Once | ORAL | Status: AC
  Administered 2012-08-08: 30 mL via ORAL
  Filled 2012-08-08: qty 30

## 2012-08-08 MED ORDER — ONDANSETRON 4 MG PO TBDP
ORAL_TABLET | ORAL | Status: AC
Start: 2012-08-08 — End: 2012-08-15

## 2012-08-08 NOTE — ED Provider Notes (Signed)
History     CSN: [id number]  Arrival date & time 08/08/12  1351   First MD Initiated Contact with Patient 08/08/12 1515      Chief Complaint  Patient presents with  . Emesis  . Sore Throat    (Consider location/radiation/quality/duration/timing/severity/associated sxs/prior treatment) HPI Comments: Priscilla Russell is a 31 y.o. Female here with sore throat and vomiting. Vomiting since 2 am today, + sore throat today. 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 No fever, no chest pain, no abdominal pain. No fevers.    Patient is a 31 y.o. female presenting with pharyngitis. The history is provided by the patient.  Sore Throat    History reviewed. No pertinent past medical history.  History reviewed. No pertinent past surgical history.  No family history on file.  History  Substance Use Topics  . Smoking status: Current Everyday Smoker  . Smokeless tobacco: Not on file  . Alcohol Use: No    OB History    Grav Para Term Preterm Abortions TAB SAB Ect Mult Living                  Review of Systems  HENT: Positive for sore throat and voice change.   All other systems reviewed and are negative.    Allergies  Review of patient's allergies indicates no known allergies.  Home Medications   Current Outpatient Rx  Name Route Sig Dispense Refill  . ONDANSETRON 4 MG PO TBDP  4mg  ODT q4 hours prn nausea/vomit 4 tablet 0    BP 110/56  Pulse 57  Temp 98.6 F (37 C) (Oral)  Resp 16  SpO2 98%  Physical Exam  Nursing note and vitals reviewed. Constitutional: She is oriented to person, place, and time. She appears well-developed and well-nourished.  HENT:  Head: Normocephalic.       OP not erythematous. No cervical lymphadenopathy.   Eyes: Conjunctivae and EOM are normal. Pupils are equal, round, and reactive to light.  Neck: Normal range of motion. Neck supple.  Cardiovascular: Normal rate, regular rhythm, normal heart sounds and  intact distal pulses.   Pulmonary/Chest: Effort normal and breath sounds normal.  Abdominal: Soft. Bowel sounds are normal.  Musculoskeletal: Normal range of motion.  Neurological: She is alert and oriented to person, place, and time.  Skin: Skin is warm and dry.  Psychiatric: She has a normal mood and affect. Her behavior is normal. Judgment and thought content normal.    ED Course  Procedures (including critical care time)   Labs Reviewed  RAPID STREP SCREEN  CBC WITH DIFFERENTIAL  BASIC METABOLIC PANEL   No results found.   1. Sore throat       MDM  [name] is a 31 y.o. female here with sore throat, likely viral. Will check CBC, BMP, rapid strep test. Will give GI cocktail for symptomatic relief.   3:48 PM She felt better after Gi cocktail. Labs including rapid strep nl. Recommend that she rests her voice for 2 days. Return instructions given.         Richardean Canal, MD 08/08/12 [phone]

## 2012-08-08 NOTE — ED Notes (Signed)
Onset one day ago nausea and emesis denies abdominal pain. Woke up with sore throat 10/10. Airway intact bilateral equal chest rise and fall.

## 2014-03-30 ENCOUNTER — Ambulatory Visit: Payer: Self-pay | Admitting: Neurology

## 2014-04-17 ENCOUNTER — Encounter: Payer: Self-pay | Admitting: *Deleted

## 2014-04-20 ENCOUNTER — Ambulatory Visit: Payer: Self-pay | Admitting: Neurology

## 2015-11-05 ENCOUNTER — Encounter (HOSPITAL_BASED_OUTPATIENT_CLINIC_OR_DEPARTMENT_OTHER): Payer: Self-pay | Admitting: Emergency Medicine

## 2015-11-05 ENCOUNTER — Emergency Department (HOSPITAL_BASED_OUTPATIENT_CLINIC_OR_DEPARTMENT_OTHER): Payer: Self-pay

## 2015-11-05 ENCOUNTER — Emergency Department (HOSPITAL_BASED_OUTPATIENT_CLINIC_OR_DEPARTMENT_OTHER)
Admission: EM | Admit: 2015-11-05 | Discharge: 2015-11-05 | Disposition: A | Discharge status: Home / Self Care | Payer: Self-pay | Attending: Emergency Medicine | Admitting: Emergency Medicine

## 2015-11-05 DIAGNOSIS — G44019 Episodic cluster headache, not intractable: Secondary | ICD-10-CM | POA: Insufficient documentation

## 2015-11-05 DIAGNOSIS — Z720 Tobacco use: Secondary | ICD-10-CM | POA: Insufficient documentation

## 2015-11-05 DIAGNOSIS — R112 Nausea with vomiting, unspecified: Secondary | ICD-10-CM | POA: Insufficient documentation

## 2015-11-05 MED ORDER — OXYCODONE-ACETAMINOPHEN 5-325 MG PO TABS: 1.0000 | ORAL_TABLET | ORAL | Status: DC | PRN

## 2015-11-05 MED ORDER — PROCHLORPERAZINE EDISYLATE 5 MG/ML IJ SOLN
10.0000 mg | Freq: Four times a day (QID) | INTRAMUSCULAR | Status: DC | PRN
  Administered 2015-11-05: 10 mg via INTRAVENOUS
  Filled 2015-11-05: qty 2

## 2015-11-05 NOTE — ED Notes (Addendum)
Pt in c/o migraine. States she has been having many migraines lately but this is far worse and won't go away with her meds like they usually do. Endorses emesis with migraine. Pt is alert, interactive, and in NAD.

## 2015-11-05 NOTE — ED Provider Notes (Signed)
CSN: [id number]     Arrival date & time 11/05/15  1840 History  By signing my name below, I, Ronney LionSuzanne Le, attest that this documentation has been prepared under the direction and in the presence of Margarita Grizzleanielle Tanyia Grabbe, MD. Electronically Signed: Ronney LionSuzanne Le, ED Scribe. 11/05/2015. 9:37 PM.     Chief Complaint  Patient presents with  . Migraine   Patient is a 34 y.o. female presenting with migraines. The history is provided by the patient. No language interpreter was used.  Migraine Associated symptoms include headaches.    HPI Comments: Priscilla Russell is a 34 y.o. female who presents to the Emergency Department complaining of a headache that began 6-8 months ago. She has no prior history of migraine.  Pain is sharp and throbbing on the left side of the head with associated eye watering.  She has nausea and some vomiting, more vomiting today than usual.  Emesis is nbnb.  She has not had any head injury, seizure, cancer history.  History reviewed. No pertinent past medical history. History reviewed. No pertinent past surgical history. History reviewed. No pertinent family history. Social History  Substance Use Topics  . Smoking status: Current Every Day Smoker  . Smokeless tobacco: None  . Alcohol Use: No   OB History    No data available     Review of Systems  Gastrointestinal: Positive for nausea and vomiting.  Neurological: Positive for headaches.  All other systems reviewed and are negative.  Allergies  Review of patient's allergies indicates no known allergies.  Home Medications   Prior to Admission medications   Not on File   BP 100/62 mmHg  Pulse 52  Temp(Src) 98 F (36.7 C) (Oral)  Resp 18  Ht 5\' 1"  (1.549 m)  Wt 130 lb (58.968 kg)  BMI 24.58 kg/m2  SpO2 98% Physical Exam  Constitutional: She is oriented to person, place, and time. She appears well-developed and well-nourished. No distress.  HENT:  Head: Normocephalic and atraumatic.  Right Ear: Tympanic membrane  and external ear normal.  Left Ear: Tympanic membrane and external ear normal.  Nose: Nose normal. Right sinus exhibits no maxillary sinus tenderness and no frontal sinus tenderness. Left sinus exhibits no maxillary sinus tenderness and no frontal sinus tenderness.  Eyes: Conjunctivae and EOM are normal. Pupils are equal, round, and reactive to light. Right eye exhibits no nystagmus. Left eye exhibits no nystagmus.  Neck: Normal range of motion. Neck supple. No tracheal deviation present.  Cardiovascular: Normal rate, regular rhythm, normal heart sounds and intact distal pulses.   Pulmonary/Chest: Effort normal and breath sounds normal. No respiratory distress. She exhibits no tenderness.  Abdominal: Soft. Bowel sounds are normal. She exhibits no distension and no mass. There is no tenderness.  Musculoskeletal: Normal range of motion. She exhibits no edema or tenderness.  Neurological: She is alert and oriented to person, place, and time. She has normal strength and normal reflexes. No sensory deficit. She displays a negative Romberg sign. GCS eye subscore is 4. GCS verbal subscore is 5. GCS motor subscore is 6.  Reflex Scores:      Tricep reflexes are 2+ on the right side and 2+ on the left side.      Bicep reflexes are 2+ on the right side and 2+ on the left side.      Brachioradialis reflexes are 2+ on the right side and 2+ on the left side.      Patellar reflexes are 2+ on the right side  and 2+ on the left side.      Achilles reflexes are 2+ on the right side and 2+ on the left side.  Patient is photobic with some left eye watering Patient with normal gait without ataxia, shuffling, spasm, or antalgia. Speech is normal without dysarthria, dysphasia, or aphasia. Muscle strength is 5/5 in bilateral shoulders, elbow flexor and extensors, wrist flexor and extensors, and intrinsic hand muscles. 5/5 bilateral lower extremity hip flexors, extensors, knee flexors and extensors, and ankle dorsi and  plantar flexors.    Skin: Skin is warm and dry. No rash noted.  Psychiatric: She has a normal mood and affect. Her behavior is normal. Judgment and thought content normal.  Nursing note and vitals reviewed.   ED Course  Procedures (including critical care time)  DIAGNOSTIC STUDIES: Oxygen Saturation is 98% on RA, normal by my interpretation.    COORDINATION OF CARE: 9:20 PM - Discussed treatment plan with pt at bedside. Pt verbalized understanding and agreed to plan.   Imaging Review Ct Head Wo Contrast  11/05/2015  CLINICAL DATA:  Multiple migraine headaches over the last 3 months, very severe today, unremitting. Nausea and vomiting. EXAM: CT HEAD WITHOUT CONTRAST TECHNIQUE: Contiguous axial images were obtained from the base of the skull through the vertex without intravenous contrast. COMPARISON:  None. FINDINGS: The brain has a normal appearance without evidence of malformation, atrophy, old or acute infarction, mass lesion, hemorrhage, hydrocephalus or extra-axial collection. The calvarium is unremarkable. The paranasal sinuses, middle ears and mastoids are clear. IMPRESSION: Normal head CT Electronically Signed   By: Paulina Fusi M.D.   On: 11/05/2015 20:48   I have personally reviewed and evaluated these images and lab results as part of my medical decision-making.   MDM   Final diagnoses:  Episodic cluster headache, not intractable   33 y.o.  female has been having headaches for the past 6-8 months. He mainly involved her left eye with watering and left frontal area. She received Compazine here with good symptom control. Plan referral to allergy. We have discussed conservative therapies to manage her headaches. I discussed return precautions and need for follow-up and she voices understanding.  I personally performed the services described in this documentation, which was scribed in my presence. The recorded information has been reviewed and considered.   Margarita Grizzle,  MD 11/05/15 [phone]

## 2015-11-05 NOTE — Discharge Instructions (Signed)
Cluster Headache  Cluster headaches are recognized by their pattern of deep, intense head pain. They normally occur on one side of your head, but they may "switch sides" in subsequent episodes. Typically, cluster headaches:   · Are severe in nature.    · Occur repeatedly over weeks to months and are followed by periods of no headaches.    · Can last from 15 minutes to 3 hours.    · Occur at the same time each day, often at night.    · Occur several times a day.  CAUSES  The exact cause of cluster headaches is not known. Alcohol use may be associated with cluster headaches.  SIGNS AND SYMPTOMS   · Severe pain that begins in or around your eye or temple.    · One-sided head pain.    · Feeling sick to your stomach (nauseous).    · Sensitivity to light.    · Runny nose.    · Eye redness, tearing, and nasal stuffiness on the side of your head where you are experiencing pain.    · Sweaty, pale skin of the face.    · Droopy or swollen eyelid.    · Restlessness.  DIAGNOSIS   Cluster headaches are diagnosed based on symptoms and a physical exam. Your health care provider may order a CT scan or an MRI of your head or lab tests to see if your headaches are caused by other medical conditions.   TREATMENT   · Medicines for pain relief and to prevent recurrent attacks. Some people may need a combination of medicines.  · Oxygen for pain relief.    · Biofeedback programs to help reduce headache pain.    It may be helpful to keep a headache diary. This may help you find a trend for what is triggering your headaches. Your health care provider can develop a treatment plan.   HOME CARE INSTRUCTIONS   During cluster periods:   · Follow a regular sleep schedule. Do not vary the amount and time that you sleep from day to day. It is important to stay on the same schedule during a cluster period to help prevent headaches.    · Avoid alcohol.    · Stop smoking if you smoke.    SEEK MEDICAL CARE IF:  · You have any changes from your previous  cluster headaches either in intensity or frequency.    · You are not getting relief from medicines you are taking.    SEEK IMMEDIATE MEDICAL CARE IF:   · You faint.    · You have weakness or numbness, especially on one side of your body or face.    · You have double vision.    · You have nausea or vomiting that is not relieved within several hours.    · You cannot keep your balance or have difficulty talking or walking.    · You have neck pain or stiffness.    · You have a fever.  MAKE SURE YOU:  · Understand these instructions.    · Will watch your condition.    · Will get help right away if you are not doing well or get worse.     This information is not intended to replace advice given to you by your health care provider. Make sure you discuss any questions you have with your health care provider.     Document Released: 12/11/2005 Document Revised: 10/01/2013 Document Reviewed: 07/03/2013  Elsevier Interactive Patient Education ©2016 [employer]

## 2016-02-07 ENCOUNTER — Emergency Department (HOSPITAL_BASED_OUTPATIENT_CLINIC_OR_DEPARTMENT_OTHER)
Admission: EM | Admit: 2016-02-07 | Discharge: 2016-02-08 | Disposition: A | Discharge status: Home / Self Care | Payer: Self-pay | Attending: Emergency Medicine | Admitting: Emergency Medicine

## 2016-02-07 ENCOUNTER — Encounter (HOSPITAL_BASED_OUTPATIENT_CLINIC_OR_DEPARTMENT_OTHER): Payer: Self-pay | Admitting: *Deleted

## 2016-02-07 DIAGNOSIS — K0889 Other specified disorders of teeth and supporting structures: Secondary | ICD-10-CM | POA: Insufficient documentation

## 2016-02-07 DIAGNOSIS — R51 Headache: Secondary | ICD-10-CM | POA: Insufficient documentation

## 2016-02-07 DIAGNOSIS — F17210 Nicotine dependence, cigarettes, uncomplicated: Secondary | ICD-10-CM | POA: Insufficient documentation

## 2016-02-07 NOTE — ED Notes (Signed)
Dental pain x 3 days. States the pain is giving her a migraine headache.

## 2016-02-08 MED ORDER — HYDROCODONE-ACETAMINOPHEN 5-325 MG PO TABS
2.0000 | ORAL_TABLET | Freq: Once | ORAL | Status: AC
  Administered 2016-02-08: 2 via ORAL
  Filled 2016-02-08: qty 2

## 2016-02-08 MED ORDER — HYDROCODONE-ACETAMINOPHEN 5-325 MG PO TABS: 1.0000 | ORAL_TABLET | ORAL | Status: DC | PRN

## 2016-02-08 MED ORDER — PENICILLIN V POTASSIUM 500 MG PO TABS: 500.0000 mg | ORAL_TABLET | Freq: Four times a day (QID) | ORAL | Status: DC

## 2016-02-08 NOTE — Discharge Instructions (Signed)
1. Medications: vicodin, penicillin, usual home medications 2. Treatment: rest, drink plenty of fluids 3. Follow Up: please followup with your dentist for discussion of your diagnoses and further evaluation after today's visit; if you do not have a primary care doctor use the resource guide provided to find one; please return to the ER for high fever, difficulty swallowing or handling secretions, new or worsening symptoms    Emergency Department Resource Guide 1) Find a Doctor and Pay Out of Pocket Although you won't have to find out who is covered by your insurance plan, it is a good idea to ask around and get recommendations. You will then need to call the office and see if the doctor you have chosen will accept you as a new patient and what types of options they offer for patients who are self-pay. Some doctors offer discounts or will set up payment plans for their patients who do not have insurance, but you will need to ask so you aren't surprised when you get to your appointment.  2) Contact Your Local Health Department Not all health departments have doctors that can see patients for sick visits, but many do, so it is worth a call to see if yours does. If you don't know where your local health department is, you can check in your phone book. The CDC also has a tool to help you locate your state's health department, and many state websites also have listings of all of their local health departments.  3) Find a Walk-in Clinic If your illness is not likely to be very severe or complicated, you may want to try a walk in clinic. These are popping up all over the country in pharmacies, drugstores, and shopping centers. They're usually staffed by nurse practitioners or physician assistants that have been trained to treat common illnesses and complaints. They're usually fairly quick and inexpensive. However, if you have serious medical issues or chronic medical problems, these are probably not your best  option.  No Primary Care Doctor: - Call Health Connect at  [phone] - they can help you locate a primary care doctor that  accepts your insurance, provides certain services, etc. - Physician Referral Service- [phone]  Chronic Pain Problems: Organization         Address  Phone   Notes  [facility]  [phone] Patients need to be referred by their primary care doctor.   Medication Assistance: Organization         Address  Phone   Notes  [facility] Medication [facility] [address] [place] [place], [place] [address] [phone] --Must be a resident of [facility] -- Must have NO insurance coverage whatsoever (no Medicaid/ Medicare, etc.) -- The pt. MUST have a primary care doctor that directs their care regularly and follows them in the community   [facility]  [phone]   [employer]  [phone]    Agencies that provide inexpensive medical care: Organization         Address  Phone   Notes  [facility]  [phone]   [facility]    [phone]   [facility] [address] [place], [place] [address] [phone]   [facility] [address] [place] [place] [phone]   Planned Parenthood    [phone]   [facility]    [phone]   [facility]  [address], [place] Phone:  [phone], Fax:  [phone] Hours of Operation:  9 am - 6 pm, M-F.  Also accepts Medicaid/Medicare and self-pay.  [facility] for [place], [address], [place] Phone: [phone], Fax: [phone]. Hours of Operation:  8:30 am - 5:30 pm, M-F.  Also accepts Medicaid and self-pay.  [facility] [place] [address], [place] Phone: [phone]   [place], [place], [place] [phone], Ext. [phone] Mondays & Thursdays: 7-9 AM.  First 15  patients are seen on a first come, first serve basis.    [place] Providers:  Organization         Address  Phone   Notes  [facility] [address], [place] [phone] Also accepts self-pay patients.  [facility] [address], [place]  [phone]   [place], [address], [place] [phone]   [facility] [address] [place] [phone]   Lucianne Lei [address], [place]   [phone] Only accepts [place] Access [place] patients after they have their name applied to their card.   Self-Pay (no insurance) in [facility]:  Organization         Address  Phone   Notes  Sickle Cell Patients, [facility] [place] [phone]   [facility] [place] [phone]   [facility] [place]  [place], [place], [place] [phone]   [facility]/Dr. Osei-Bonsu  [address], [place] [address], [place] [phone] Phone number for both [place] and [place] locations is the same.  [facility] and [facility] [address] [place] [phone]   [facility] [address], [place] or [address] [phone]   [facility] [address] [place] [phone], phone; [phone], fax Sees patients 1st and 3rd Saturday of every month.  Must not qualify for public or private insurance (i.e. Medicaid, Medicare, [place] Health Choice, Veterans' Benefits)  Household income should be no more than 200% of the poverty level The clinic cannot treat you if you are pregnant or think you are pregnant  Sexually transmitted diseases are not treated at the clinic.    Dental  Care: Organization         Address  Phone  Notes  [facility] Department of [facility] [place] [phone] Accepts children up to age 61 who are enrolled in [place] or [employer]; pregnant women with a Medicaid card; and children who have applied for Medicaid or [place] Health Choice, but were declined, whose parents can pay a reduced fee at time of service.  [facility] Department of [facility]  [address], [place] [phone] Accepts children up to age 47 who are enrolled in [place] or [place]; pregnant women with a Medicaid card; and children who have applied for Medicaid or [place] Health Choice, but were declined, whose parents can pay a reduced fee at time of service.  [facility] Adult Dental Access PROGRAM  [place], [place] [phone]  Patients are seen by appointment only. Walk-ins are not accepted. [place] will see patients 1 years of age and older. Monday - Tuesday (8am-5pm) Most Wednesdays (8:30-5pm) $30 per visit, cash only  [facility] Adult Dental Access PROGRAM  [address], [facility] [phone] Patients are seen by appointment only. Walk-ins are not accepted. [place] will see patients 28 years of age and older. One Wednesday Evening (Monthly: Volunteer Based).  $30 per visit, cash only  [place]  [phone] for adults; Children under age 81, call Graduate Pediatric Dentistry at [phone]. Children aged 82-14, please call [phone] to request a pediatric application.  Dental services are provided in all areas of dental care including fillings, crowns and bridges, complete and partial dentures, implants, gum treatment, root canals, and extractions. Preventive care is also provided. Treatment is provided to both adults and children. Patients are selected via a lottery and there is often a waiting list.   [facility] [address] [place]  [phone] [email or web address]   [facility] [address] [place], [place] [phone], Ext. [phone] Second and Fourth Thursday of each month, opens at 6:30 AM; Clinic ends at 9 AM.  Patients are seen on a first-come first-served basis, and a limited number are seen during each clinic.   [facility]  [address] Hillard Danker [place], [place] [phone]   Eligibility Requirements You must have lived in [place], [place], or [place] counties for at least the last three months.   You cannot be eligible for state or federal sponsored [employer], including [employer], [place], or [employer].   You generally cannot be eligible for healthcare insurance through your employer.    How to apply: Eligibility screenings are held every Tuesday and Wednesday afternoon from 1:00 pm until 4:00 pm. You do not need an appointment for the interview!  [facility] [address] [place], [place]   [place]  [place] Department  [place]  [phone]    Behavioral Health Resources in the Community: Intensive Outpatient Programs Organization         Address  Phone  Notes  [place]. [address], [place], [place] [phone]   [facility] Outpatient [address] [place], [place]   ADS: Alcohol & Drug Sv[facility] [address], [place], [place]   [place] [address],  [place], [place] or [phone]   Substance Abuse Resources Organization         Address  Phone  Notes  Alcohol and Drug Services  [phone]   [place]  [phone]   The [place]   Chinita Pester  [phone]   Residential & Outpatient Substance Abuse Program  [phone]    Psychological Services Organization         Address  Phone  Notes  [facility] [place]  [place]  [phone]   [place] [address], [place] or [phone]    Mobile Crisis Teams Organization         Address  Phone  Notes  [facility], Mobile Crisis Care Unit  [phone]   [facility]  [address] [employer], [place]   [facility] [address] [place]  [address] [place] [phone]    Self-Help/Support Groups Organization         Address  Phone             Notes  [facility] of [place] - variety of support groups  [phone]- [phone] Call for more information  Narcotics Anonymous (NA), [facility]aring [facility] [address], [employer] [place]  2 meetings at this location   Statistician         Address  Phone  Notes  [facility] [address],    [place]  [phone]   [facility]  [address] [place] [address], [place], [place] [phone]   [facility] Treatment Facility [address] [place], [place] [phone] Admissions: 8am-3pm M-F  [facility] [address],    [place], [place] [phone]   [facility] [address] [place], [place], [place] [phone]   [facility] [address]  [place], [place] [phone]   [facility] - Intensive Outpatient [address]ure[address], [place], [place] [phone]   [facility] ([facility] [address] [place]  [place], [place] [phone] or [phone]   [facility] ([facility]) [address], [place], [place] [phone] Accepts Medicaid  [facility] [place] [address]  [place] [phone] Substance Abuse/Addiction Treatment   [facility] Organization         Address  Phone  Notes  [facility]  [phone]   Angie Fava, PhD [address] Ervin Knack [place], [place]   [phone] or [phone]   [facility] Behavioral   [address] [place], [place] [phone]   [facility] [address] [place], [place] [phone] Insurance/Medicaid/sponsorship through [facility] and Families [address]                                    [place], [place] [phone] Therapy/tele-psych/case  [facility] [address], [place] [phone]    Dr. Lolly Mustache  [phone]   [facility]  [employer] [facility] Dept. 1) [address] [place] 2) [address], [place] 3)  [address], [place] [phone]  [phone]   [facility] Child Abuse Hotline [phone] or [phone] (After Hours)

## 2016-02-08 NOTE — ED Provider Notes (Signed)
CSN: [id number]     Arrival date & time 02/07/16  2213 History   First MD Initiated Contact with Patient 02/07/16 2352     Chief Complaint  Patient presents with  . Dental Pain     HPI    Priscilla Russell is a 35 y.o. female with no pertinent PMH who presents to the ED with left upper dental pain, which she states has been present for the past 3 days. She notes her symptoms are constant. She states eating exacerbates her pain. She has not tried anything for symptom relief. She denies fever, chills, difficulty swallowing, trouble handling her secretions. She notes her pain is causing her to have a left-sided headache. She denies dizziness, lightheadedness, vision changes, numbness, weakness, paresthesia. She states she has a history of headaches and that her symptoms feel similar.   History reviewed. No pertinent past medical history. Past Surgical History  Procedure Laterality Date  . Tubal ligation     No family history on file. Social History  Substance Use Topics  . Smoking status: Current Every Day Smoker -- 0.50 packs/day    Types: Cigarettes  . Smokeless tobacco: None  . Alcohol Use: No   OB History    No data available       Review of Systems  Constitutional: Negative for fever and chills.  HENT: Positive for dental problem. Negative for drooling and trouble swallowing.   Neurological: Positive for headaches. Negative for dizziness, syncope, weakness, light-headedness and numbness.      Allergies  Review of patient's allergies indicates no known allergies.  Home Medications   Prior to Admission medications   Medication Sig Start Date End Date Taking? Authorizing Provider  HYDROcodone-acetaminophen (NORCO/VICODIN) 5-325 MG tablet Take 1 tablet by mouth every 4 (four) hours as needed. 02/08/16   Mady Gemma, PA-C  oxyCODONE-acetaminophen (PERCOCET/ROXICET) 5-325 MG tablet Take 1 tablet by mouth every 4 (four) hours as needed. 11/05/15   Margarita Grizzle, MD    penicillin v potassium (VEETID) 500 MG tablet Take 1 tablet (500 mg total) by mouth 4 (four) times daily. 02/08/16   Mady Gemma, PA-C    BP 109/82 mmHg  Pulse 60  Temp(Src) 97.8 F (36.6 C) (Oral)  Resp 20  Ht  (1.549 m)  Wt 60.782 kg  BMI 25.33 kg/m2  SpO2 100%  LMP 01/17/2016 Physical Exam  Constitutional: She is oriented to person, place, and time. She appears well-developed and well-nourished. No distress.  HENT:  Head: Normocephalic and atraumatic.  Right Ear: External ear normal.  Left Ear: External ear normal.  Nose: Nose normal.  Mouth/Throat: Uvula is midline, oropharynx is clear and moist and mucous membranes are normal. No dental abscesses. No oropharyngeal exudate, posterior oropharyngeal edema, posterior oropharyngeal erythema or tonsillar abscesses.    Tenderness to palpation to left upper gumline over first and second premolar with mild associated erythema. No abscess. No swelling to floor of mouth.  Eyes: Conjunctivae and EOM are normal. Pupils are equal, round, and reactive to light. Right eye exhibits no discharge. Left eye exhibits no discharge. No scleral icterus.  Neck: Normal range of motion. Neck supple.  Cardiovascular: Normal rate and regular rhythm.   Pulmonary/Chest: Effort normal and breath sounds normal. No respiratory distress.  Musculoskeletal: Normal range of motion. She exhibits no edema or tenderness.  Neurological: She is alert and oriented to person, place, and time. She has normal strength. No cranial nerve deficit or sensory deficit.  Skin: Skin is  warm and dry. She is not diaphoretic.  Psychiatric: She has a normal mood and affect. Her behavior is normal.  Nursing note and vitals reviewed.   ED Course  Procedures (including critical care time)  Labs Review Labs Reviewed - No data to display  Imaging Review No results found.    EKG Interpretation None      MDM   Final diagnoses:  Pain, dental    35 year old  female presents with left upper dental pain and associated headache. Patient is afebrile. Vital signs stable. On exam, she has tenderness to palpation to her left upper gumline over her first and second premolar with mild associated erythema. No abscess. No swelling to floor of mouth to suggest ludwig's angina. Normal neuro exam with no focal deficit. Patient requesting oral medication, as she states she needs to leave. Patient is nontoxic and well-appearing, feel she is stable for discharge at this time. Will give short course of pain medication and antibiotic. Patient to follow with dentist. Return precautions discussed. Patient verbalizes her understanding and agreement with plan.  BP 109/82 mmHg  Pulse 60  Temp(Src) 97.8 F (36.6 C) (Oral)  Resp 20  Ht  (1.549 m)  Wt 60.782 kg  BMI 25.33 kg/m2  SpO2 100%  LMP 01/17/2016      Mady Gemma, PA-C 02/08/16 0235  Paula Libra, MD 02/08/16 [phone]

## 2017-06-12 ENCOUNTER — Ambulatory Visit (INDEPENDENT_AMBULATORY_CARE_PROVIDER_SITE_OTHER): Payer: Self-pay | Admitting: Physician Assistant

## 2017-07-10 ENCOUNTER — Other Ambulatory Visit (HOSPITAL_COMMUNITY)
Admission: RE | Admit: 2017-07-10 | Discharge: 2017-07-10 | Disposition: A | Discharge status: Home / Self Care | Payer: Self-pay | Source: Ambulatory Visit | Attending: Physician Assistant | Admitting: Physician Assistant

## 2017-07-10 ENCOUNTER — Encounter (INDEPENDENT_AMBULATORY_CARE_PROVIDER_SITE_OTHER): Payer: Self-pay | Admitting: Physician Assistant

## 2017-07-10 ENCOUNTER — Ambulatory Visit (INDEPENDENT_AMBULATORY_CARE_PROVIDER_SITE_OTHER): Payer: Self-pay | Admitting: Physician Assistant

## 2017-07-10 VITALS — BP 114/77 | HR 57 | Temp 98.1°F | Ht 62.6 in | Wt 144.6 lb

## 2017-07-10 DIAGNOSIS — Z23 Encounter for immunization: Secondary | ICD-10-CM

## 2017-07-10 DIAGNOSIS — N920 Excessive and frequent menstruation with regular cycle: Secondary | ICD-10-CM

## 2017-07-10 DIAGNOSIS — Z114 Encounter for screening for human immunodeficiency virus [HIV]: Secondary | ICD-10-CM | POA: Insufficient documentation

## 2017-07-10 DIAGNOSIS — Z0000 Encounter for general adult medical examination without abnormal findings: Secondary | ICD-10-CM | POA: Insufficient documentation

## 2017-07-10 DIAGNOSIS — N941 Unspecified dyspareunia: Secondary | ICD-10-CM

## 2017-07-10 DIAGNOSIS — Z124 Encounter for screening for malignant neoplasm of cervix: Secondary | ICD-10-CM

## 2017-07-10 DIAGNOSIS — B379 Candidiasis, unspecified: Secondary | ICD-10-CM

## 2017-07-10 DIAGNOSIS — Z111 Encounter for screening for respiratory tuberculosis: Secondary | ICD-10-CM

## 2017-07-10 MED ORDER — FLUCONAZOLE 150 MG PO TABS: 150.0000 mg | ORAL_TABLET | Freq: Once | ORAL | 0 refills | Status: AC

## 2017-07-10 NOTE — Patient Instructions (Signed)

## 2017-07-10 NOTE — Progress Notes (Signed)
Subjective:  Patient ID: Priscilla Russell, female    DOB: [birth year]  Age: 36 y.o. MRN: [id number]  CC: annual physical  HPI Priscilla Russell is a 36 y.o. female with a PMH of migraine headaches and BTL 2009 presents for a physical exam and PAP smear.  She is mostly concerned with heavy menstrual bleeding and dysparuenia. Has been researching her symptoms and thinks she has endometriosis. Menstrual period occur at regular intervals. Needs a PPD done for work. Does not endorse any other symptoms besides occasional headaches.      ROS Review of Systems  Constitutional: Negative for chills, fever and malaise/fatigue.  Eyes: Negative for blurred vision.  Respiratory: Negative for shortness of breath.   Cardiovascular: Negative for chest pain and palpitations.  Gastrointestinal: Negative for abdominal pain and nausea.  Genitourinary: Negative for dysuria and hematuria.       Menorrhagia and dyspareunia   Musculoskeletal: Negative for joint pain and myalgias.  Skin: Negative for rash.  Neurological: Positive for headaches. Negative for tingling.  Psychiatric/Behavioral: Negative for depression. The patient is not nervous/anxious.     Objective:  BP 114/77 (BP Location: Left Arm, Patient Position: Sitting, Cuff Size: Normal)   Pulse (!) 57   Temp 98.1 F (36.7 C) (Oral)   Ht 5' 2.6" (1.59 m)   Wt 144 lb 9.6 oz (65.6 kg)   LMP 06/05/2017 (Exact Date)   SpO2 99%   BMI 25.94 kg/m   BP/Weight 07/10/2017 02/08/2016 02/07/2016  Systolic BP 114 109 -  Diastolic BP 77 82 -  Wt. (Lbs) 144.6 - 134  BMI 25.94 - 25.33      Physical Exam  Constitutional: She is oriented to person, place, and time.  Well developed, well nourished, NAD, polite  HENT:  Head: Normocephalic and atraumatic.  Mouth/Throat: No oropharyngeal exudate.  Eyes: Pupils are equal, round, and reactive to light. Conjunctivae and EOM are normal. No scleral icterus.  Neck: Normal range of motion. Neck supple. No thyromegaly  present.  Cardiovascular: Normal rate, regular rhythm and normal heart sounds.   Pulmonary/Chest: Effort normal and breath sounds normal.  Abdominal: Soft. Bowel sounds are normal. She exhibits no distension and no mass. There is no tenderness. There is no rebound and no guarding.  Genitourinary:  Genitourinary Comments: Vagina normal with thick white and clumpy discharge, cervix normal, no cervical motion tenderness. Adnexa without tenderness or mass bilaterally. Uterus without tenderness or mass.  Musculoskeletal: She exhibits no edema or deformity.  LEs, UEs, and back with full aROM  Lymphadenopathy:    She has cervical adenopathy.  Neurological: She is alert and oriented to person, place, and time. No cranial nerve deficit. Coordination normal.  Skin: Skin is warm and dry. No rash noted. No erythema. No pallor.  Psychiatric: She has a normal mood and affect. Her behavior is normal. Thought content normal.  Vitals reviewed.    Assessment & Plan:   1. Annual physical exam - Lipid panel - Comprehensive metabolic panel - CBC with Differential - TSH  2. Screening for cervical cancer - PAP  3. Need for Tdap vaccination - Tdap vaccine greater than or equal to 7yo IM  4. Dyspareunia in female - US Pelvis Complete; Future - US Transvaginal Non-OB; Future  5. Menorrhagia with regular cycle - US Pelvis Complete; Future - US Transvaginal Non-OB; Future - TSH  6. Encounter for screening for HIV - HIV antibody  7. PPD screening test - TB Skin Test     Follow-up:  2 weeks, headache  Loletta Specteroger David Priscilla Stirn PA

## 2017-07-11 LAB — CBC WITH DIFFERENTIAL/PLATELET
BASOS: 0 %
Basophils Absolute: 0 10*3/uL (ref 0.0–0.2)
EOS (ABSOLUTE): 0.1 10*3/uL (ref 0.0–0.4)
Eos: 1 %
Hematocrit: 43.1 % (ref 34.0–46.6)
Hemoglobin: 14.3 g/dL (ref 11.1–15.9)
IMMATURE GRANULOCYTES: 0 %
Immature Grans (Abs): 0 10*3/uL (ref 0.0–0.1)
LYMPHS: 28 %
Lymphocytes Absolute: 2 10*3/uL (ref 0.7–3.1)
MCH: 27.6 pg (ref 26.6–33.0)
MCHC: 33.2 g/dL (ref 31.5–35.7)
MCV: 83 fL (ref 79–97)
Monocytes Absolute: 0.3 10*3/uL (ref 0.1–0.9)
Monocytes: 4 %
Neutrophils Absolute: 4.7 10*3/uL (ref 1.4–7.0)
Neutrophils: 67 %
PLATELETS: 279 10*3/uL (ref 150–379)
RBC: 5.19 x10E6/uL (ref 3.77–5.28)
RDW: 14.6 % (ref 12.3–15.4)
WBC: 7 10*3/uL (ref 3.4–10.8)

## 2017-07-11 LAB — COMPREHENSIVE METABOLIC PANEL
A/G RATIO: 1.7 (ref 1.2–2.2)
ALT: 14 IU/L (ref 0–32)
AST: 18 IU/L (ref 0–40)
Albumin: 4.7 g/dL (ref 3.5–5.5)
Alkaline Phosphatase: 58 IU/L (ref 39–117)
BUN/Creatinine Ratio: 13 (ref 9–23)
BUN: 10 mg/dL (ref 6–20)
Bilirubin Total: 1.3 mg/dL — ABNORMAL HIGH (ref 0.0–1.2)
CALCIUM: 9.6 mg/dL (ref 8.7–10.2)
CO2: 22 mmol/L (ref 20–29)
CREATININE: 0.78 mg/dL (ref 0.57–1.00)
Chloride: 100 mmol/L (ref 96–106)
GFR calc Af Amer: 112 mL/min/{1.73_m2} (ref >59)
GFR, EST NON AFRICAN AMERICAN: 97 mL/min/{1.73_m2} (ref >59)
GLUCOSE: 93 mg/dL (ref 65–99)
Globulin, Total: 2.7 g/dL (ref 1.5–4.5)
POTASSIUM: 4.4 mmol/L (ref 3.5–5.2)
Sodium: 140 mmol/L (ref 134–144)
Total Protein: 7.4 g/dL (ref 6.0–8.5)

## 2017-07-11 LAB — LIPID PANEL
Chol/HDL Ratio: 2.9 ratio (ref 0.0–4.4)
Cholesterol, Total: 167 mg/dL (ref 100–199)
HDL: 58 mg/dL (ref >39)
LDL CALC: 94 mg/dL (ref 0–99)
Triglycerides: 75 mg/dL (ref 0–149)
VLDL Cholesterol Cal: 15 mg/dL (ref 5–40)

## 2017-07-11 LAB — HIV ANTIBODY (ROUTINE TESTING W REFLEX): HIV Screen 4th Generation wRfx: NONREACTIVE

## 2017-07-11 LAB — TSH: TSH: 1.87 u[IU]/mL (ref 0.450–4.500)

## 2017-07-12 ENCOUNTER — Other Ambulatory Visit (INDEPENDENT_AMBULATORY_CARE_PROVIDER_SITE_OTHER): Payer: Self-pay | Admitting: Physician Assistant

## 2017-07-12 DIAGNOSIS — B9689 Other specified bacterial agents as the cause of diseases classified elsewhere: Secondary | ICD-10-CM

## 2017-07-12 DIAGNOSIS — N760 Acute vaginitis: Principal | ICD-10-CM

## 2017-07-12 LAB — CYTOLOGY - PAP
BACTERIAL VAGINITIS: POSITIVE — AB
CANDIDA VAGINITIS: NEGATIVE
CHLAMYDIA, DNA PROBE: NEGATIVE
Diagnosis: NEGATIVE
Neisseria Gonorrhea: NEGATIVE
Trichomonas: NEGATIVE

## 2017-07-12 MED ORDER — METRONIDAZOLE 500 MG PO TABS: 500.0000 mg | ORAL_TABLET | Freq: Two times a day (BID) | ORAL | 0 refills | Status: DC

## 2017-07-13 ENCOUNTER — Other Ambulatory Visit (INDEPENDENT_AMBULATORY_CARE_PROVIDER_SITE_OTHER): Payer: Self-pay

## 2017-07-13 ENCOUNTER — Other Ambulatory Visit (INDEPENDENT_AMBULATORY_CARE_PROVIDER_SITE_OTHER): Payer: Self-pay | Admitting: Physician Assistant

## 2017-07-13 DIAGNOSIS — B9689 Other specified bacterial agents as the cause of diseases classified elsewhere: Secondary | ICD-10-CM

## 2017-07-13 DIAGNOSIS — N760 Acute vaginitis: Principal | ICD-10-CM

## 2017-07-13 LAB — TB SKIN TEST
Induration: 0 mm
TB Skin Test: NEGATIVE

## 2017-07-13 MED ORDER — FLUCONAZOLE 150 MG PO TABS: 150.0000 mg | ORAL_TABLET | Freq: Once | ORAL | 0 refills | Status: AC

## 2017-07-13 MED ORDER — METRONIDAZOLE 500 MG PO TABS: 500.0000 mg | ORAL_TABLET | Freq: Two times a day (BID) | ORAL | 0 refills | Status: AC

## 2017-07-24 ENCOUNTER — Ambulatory Visit (INDEPENDENT_AMBULATORY_CARE_PROVIDER_SITE_OTHER): Payer: Self-pay | Admitting: Physician Assistant

## 2017-07-27 ENCOUNTER — Ambulatory Visit (INDEPENDENT_AMBULATORY_CARE_PROVIDER_SITE_OTHER): Payer: Self-pay

## 2017-10-19 ENCOUNTER — Emergency Department (HOSPITAL_COMMUNITY): Payer: No Typology Code available for payment source

## 2017-10-19 ENCOUNTER — Emergency Department (HOSPITAL_COMMUNITY)
Admission: EM | Admit: 2017-10-19 | Discharge: 2017-10-19 | Disposition: A | Discharge status: Home / Self Care | Payer: No Typology Code available for payment source | Attending: Emergency Medicine | Admitting: Emergency Medicine

## 2017-10-19 ENCOUNTER — Encounter (HOSPITAL_COMMUNITY): Payer: Self-pay | Admitting: *Deleted

## 2017-10-19 DIAGNOSIS — R079 Chest pain, unspecified: Secondary | ICD-10-CM | POA: Insufficient documentation

## 2017-10-19 DIAGNOSIS — M7918 Myalgia, other site: Secondary | ICD-10-CM

## 2017-10-19 DIAGNOSIS — F17210 Nicotine dependence, cigarettes, uncomplicated: Secondary | ICD-10-CM | POA: Diagnosis not present

## 2017-10-19 DIAGNOSIS — Y939 Activity, unspecified: Secondary | ICD-10-CM | POA: Diagnosis not present

## 2017-10-19 DIAGNOSIS — Y929 Unspecified place or not applicable: Secondary | ICD-10-CM | POA: Diagnosis not present

## 2017-10-19 DIAGNOSIS — Y999 Unspecified external cause status: Secondary | ICD-10-CM | POA: Insufficient documentation

## 2017-10-19 DIAGNOSIS — M25511 Pain in right shoulder: Secondary | ICD-10-CM | POA: Diagnosis not present

## 2017-10-19 DIAGNOSIS — M542 Cervicalgia: Secondary | ICD-10-CM | POA: Diagnosis not present

## 2017-10-19 LAB — POC URINE PREG, ED: Preg Test, Ur: NEGATIVE

## 2017-10-19 MED ORDER — ACETAMINOPHEN 325 MG PO TABS
650.0000 mg | ORAL_TABLET | Freq: Once | ORAL | Status: AC
  Administered 2017-10-19: 650 mg via ORAL
  Filled 2017-10-19: qty 2

## 2017-10-19 MED ORDER — NAPROXEN 500 MG PO TABS: 500.0000 mg | ORAL_TABLET | Freq: Two times a day (BID) | ORAL | 0 refills | Status: DC
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
## 2018-06-04 ENCOUNTER — Emergency Department (HOSPITAL_COMMUNITY)
Admission: EM | Admit: 2018-06-04 | Discharge: 2018-06-04 | Disposition: A | Discharge status: Home / Self Care | Payer: Self-pay | Attending: Emergency Medicine | Admitting: Emergency Medicine

## 2018-06-04 ENCOUNTER — Emergency Department (HOSPITAL_COMMUNITY): Payer: Self-pay

## 2018-06-04 ENCOUNTER — Encounter (HOSPITAL_COMMUNITY): Payer: Self-pay | Admitting: *Deleted

## 2018-06-04 ENCOUNTER — Other Ambulatory Visit: Payer: Self-pay

## 2018-06-04 DIAGNOSIS — Y929 Unspecified place or not applicable: Secondary | ICD-10-CM | POA: Insufficient documentation

## 2018-06-04 DIAGNOSIS — Y939 Activity, unspecified: Secondary | ICD-10-CM | POA: Insufficient documentation

## 2018-06-04 DIAGNOSIS — S161XXA Strain of muscle, fascia and tendon at neck level, initial encounter: Secondary | ICD-10-CM

## 2018-06-04 DIAGNOSIS — F17200 Nicotine dependence, unspecified, uncomplicated: Secondary | ICD-10-CM | POA: Insufficient documentation

## 2018-06-04 DIAGNOSIS — Y999 Unspecified external cause status: Secondary | ICD-10-CM | POA: Insufficient documentation

## 2018-06-04 MED ORDER — CYCLOBENZAPRINE HCL 5 MG PO TABS: 5.0000 mg | ORAL_TABLET | Freq: Three times a day (TID) | ORAL | 0 refills | Status: DC | PRN

## 2018-06-04 MED ORDER — NAPROXEN 500 MG PO TABS: 500.0000 mg | ORAL_TABLET | Freq: Two times a day (BID) | ORAL | 0 refills | Status: DC
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
## 2021-03-22 ENCOUNTER — Encounter (INDEPENDENT_AMBULATORY_CARE_PROVIDER_SITE_OTHER): Payer: Self-pay | Admitting: Primary Care

## 2021-03-22 ENCOUNTER — Other Ambulatory Visit: Payer: Self-pay

## 2021-03-22 ENCOUNTER — Ambulatory Visit (INDEPENDENT_AMBULATORY_CARE_PROVIDER_SITE_OTHER): Payer: Self-pay | Admitting: Primary Care

## 2021-03-22 VITALS — BP 102/65 | HR 68 | Temp 97.3°F | Ht 61.0 in | Wt 151.4 lb

## 2021-03-22 DIAGNOSIS — G43909 Migraine, unspecified, not intractable, without status migrainosus: Secondary | ICD-10-CM | POA: Insufficient documentation

## 2021-03-22 DIAGNOSIS — Z131 Encounter for screening for diabetes mellitus: Secondary | ICD-10-CM

## 2021-03-22 DIAGNOSIS — G43109 Migraine with aura, not intractable, without status migrainosus: Secondary | ICD-10-CM

## 2021-03-22 DIAGNOSIS — R7303 Prediabetes: Secondary | ICD-10-CM

## 2021-03-22 DIAGNOSIS — Z7689 Persons encountering health services in other specified circumstances: Secondary | ICD-10-CM

## 2021-03-22 LAB — POCT GLYCOSYLATED HEMOGLOBIN (HGB A1C): Hemoglobin A1C: 5.7 % — AB (ref 4.0–5.6)

## 2021-03-22 MED ORDER — IBUPROFEN 600 MG PO TABS: 600.0000 mg | ORAL_TABLET | Freq: Three times a day (TID) | ORAL | 1 refills | Status: DC | PRN

## 2021-03-22 NOTE — Patient Instructions (Signed)
Chronic Migraine Headache A migraine headache is throbbing pain that is usually on one side of the head. Migraines that keep coming back are called recurring migraines. A migraine is called a chronic migraine if it happens at least 15 days in a month for more than 3 months. Talk with your doctor about what things may bring on (trigger) your migraines. What are the causes? The exact cause of this condition is not known. A migraine may be caused when nerves in the brain become irritated and release chemicals that cause irritation and swelling (inflammation) of blood vessels. The irritation and swelling of the blood vessels causes pain. Migraines may be brought on or caused by:  Smoking.  Foods and drinks, such as: ? Cheese. ? Chocolate. ? Alcohol. ? Caffeine.  Certain substances in some foods or drinks.  Some medicines. Other things that may bring on a migraine include:  Periods, for women.  Stress.  Not enough sleep or too much sleep.  Feeling very tired.  Bright lights or loud noises.  Smells  Weather changes and being at high altitude. What increases the risk? The following factors may make you more likely to have chronic migraine:  Having migraines or family members who have them.  Being very sad (depressed) or feeling worried or nervous (anxious).  Taking a lot of pain medicine.  Having problems sleeping.  Having heart disease, diabetes, or being very overweight (obese). What are the signs or symptoms? Symptoms of this condition include:  Pain that feels like it throbs.  Pain that is usually only on one side of the head. In some cases, the pain may be on both sides of the head or around the head or neck.  Very bad pain that keeps you from doing daily activities.  Pain that gets worse with activity.  Feeling like you may vomit (feeling nauseous) or vomiting.  Pain when you are around bright lights, loud noises, or activity.  Being sensitive to bright  lights, loud noises, or smells.  Feeling dizzy. How is this treated? This condition is treated with:  Medicines. These help to: ? Lessen pain and the feeling like you may vomit. ? Prevent migraines.  Changes to your diet or sleep.  Therapy. This might include: ? Relaxation training. ? Biofeedback. This is a treatment that teaches you to relax, use your brain to lower your heart rate, and control your breathing. ? Cognitive behavioral therapy (CBT). This therapy helps you set goals and follow up on the changes that you make.  Acupuncture.  Using a device that provides electrical stimulation to your nerves, which can help take away pain.  Surgery, if the other treatments do not work. Follow these instructions at home: Medicines  Take over-the-counter and prescription medicines only as told by your doctor.  Ask your doctor if the medicine prescribed to you requires you to avoid driving or using machinery. Lifestyle  Do not use any products that contain nicotine or tobacco, such as cigarettes, e-cigarettes, and chewing tobacco. If you need help quitting, ask your doctor.  Do not drink alcohol.  Get 7-9 hours of sleep each night.  Lower the stress in your life. Ask your doctor about ways to do this.  Stay at a healthy weight. Talk with your doctor if you need help losing weight.  Get regular exercise.   General instructions  Keep a journal to find out if certain things bring on migraines. For example, write down: ? What you eat and drink. ? How   much sleep you get. ? Any change to your diet or medicines.  Lie down in a dark, quiet room when you have a migraine.  Try placing a cool towel over your head when you have a migraine.  Keep lights dim if bright lights bother you or make your migraines worse.  Keep all follow-up visits as told by your doctor. This is important.   Where to find more information  Coalition for Headache and Migraine Patients (CHAMP):  headachemigraine.org  American Migraine Foundation: americanmigrainefoundation.org  National Headache Foundation: headaches.org Contact a doctor if:  Medicine does not help your migraine.  Your pain keeps coming back. Get help right away if:  Your migraine becomes really bad and medicine does not help.  You have a stiff neck and fever.  You have trouble seeing.  Your muscles are weak or you lose control of them.  You lose your balance or have trouble walking.  You feel like you will faint or you faint.  You start having sudden, very bad headaches.  You have a seizure. Summary  A migraine headache is very bad, throbbing pain that is usually on one side of the head.  A chronic migraine is a migraine that happens 15 days in a month for more than 3 months.  Talk with your doctor about what things may bring on your migraines.  Lie down in a dark, quiet room when you have a migraine.  Keep a journal. This can help you find out if certain things make you have migraines. This information is not intended to replace advice given to you by your health care provider. Make sure you discuss any questions you have with your health care provider. Document Revised: 01/28/2020 Document Reviewed: 01/28/2020 Elsevier Patient Education  2021 [employer]  

## 2021-03-22 NOTE — Progress Notes (Signed)
New Patient Office Visit  Subjective:  Patient ID: Priscilla Russell, female    DOB: 1981-08-01  Age: 40 y.o. MRN: [id number]  CC:  Chief Complaint  Patient presents with  . New Patient (Initial Visit)    HPI Priscilla Russell is a 40 year old female who  presents for establish care. She complains she has Migraines with aura (feels like eyes are going to fall out of her head) just before or after menstrual cycle. Treated with Excerderine until started upsetting her stomach    No past medical history on file.  Past Surgical History:  Procedure Laterality Date  . TUBAL LIGATION      No family history on file.  Social History   Socioeconomic History  . Marital status: Single    Spouse name: Not on file  . Number of children: Not on file  . Years of education: Not on file  . Highest education level: Not on file  Occupational History  . Not on file  Tobacco Use  . Smoking status: Current Every Day Smoker    Packs/day: 0.50    Types: Cigars  . Smokeless tobacco: Never Used  Substance and Sexual Activity  . Alcohol use: No  . Drug use: No  . Sexual activity: Not on file  Other Topics Concern  . Not on file  Social History Narrative  . Not on file   Social Determinants of Health   Financial Resource Strain: Not on file  Food Insecurity: Not on file  Transportation Needs: Not on file  Physical Activity: Not on file  Stress: Not on file  Social Connections: Not on file  Intimate Partner Violence: Not on file    ROS Review of Systems  Genitourinary: Positive for menstrual problem.  Neurological: Positive for headaches.       Migraines with aura (feels like eyes are going to fall out of her head   All other systems reviewed and are negative.   Objective:  BP 102/65 (BP Location: Right Arm, Patient Position: Sitting, Cuff Size: Normal)   Pulse 68   Temp (!) 97.3 F (36.3 C) (Temporal)   Ht 5\' 1"  (1.549 m)   Wt 151 lb 6.4 oz (68.7 kg)   LMP 02/23/2021  (Approximate)   SpO2 100%   BMI 28.61 kg/m   Physical Exam Vitals reviewed.  Constitutional:      Appearance: Normal appearance.  HENT:     Head: Normocephalic.     Right Ear: Tympanic membrane and external ear normal.     Left Ear: Tympanic membrane and external ear normal.     Nose: Nose normal.  Eyes:     Extraocular Movements: Extraocular movements intact.     Pupils: Pupils are equal, round, and reactive to light.  Cardiovascular:     Rate and Rhythm: Normal rate and regular rhythm.  Pulmonary:     Effort: Pulmonary effort is normal.     Breath sounds: Normal breath sounds.  Abdominal:     General: Bowel sounds are normal.     Palpations: Abdomen is soft.  Musculoskeletal:        General: Normal range of motion.     Cervical back: Normal range of motion.  Skin:    General: Skin is warm and dry.  Neurological:     Mental Status: She is alert and oriented to person, place, and time.  Psychiatric:        Mood and Affect: Mood normal.  Behavior: Behavior normal.        Thought Content: Thought content normal.        Judgment: Judgment normal.     Assessment & Plan:  Arma was seen today for new patient (initial visit).  Diagnoses and all orders for this visit:  Screening for diabetes mellitus -     HgB A1c 5.7 Prediabetes new dx per ADA guidelines decrease carbs   Encounter to establish care Establish care  Migraine with aura and without status migrainosus, not intractable Underlying causes of migraines is excessive caffeine intake coffee and soda . Provided information on AVS and prescribed ibuprofen 600mg  prn   Outpatient Encounter Medications as of 03/22/2021  Medication Sig  . [DISCONTINUED] cyclobenzaprine (FLEXERIL) 5 MG tablet Take 1 tablet (5 mg total) by mouth 3 (three) times daily as needed for muscle spasms.  . [DISCONTINUED] naproxen (NAPROSYN) 500 MG tablet Take 1 tablet (500 mg total) by mouth 2 (two) times daily.   No  facility-administered encounter medications on file as of 03/22/2021.    Follow-up: Return for gyn visit .   03/24/2021, NP

## 2021-03-28 ENCOUNTER — Other Ambulatory Visit (HOSPITAL_COMMUNITY)
Admission: RE | Admit: 2021-03-28 | Discharge: 2021-03-28 | Disposition: A | Discharge status: Home / Self Care | Payer: Self-pay | Source: Ambulatory Visit | Attending: Primary Care | Admitting: Primary Care

## 2021-03-28 ENCOUNTER — Encounter (INDEPENDENT_AMBULATORY_CARE_PROVIDER_SITE_OTHER): Payer: Self-pay | Admitting: Primary Care

## 2021-03-28 ENCOUNTER — Other Ambulatory Visit: Payer: Self-pay

## 2021-03-28 ENCOUNTER — Ambulatory Visit (INDEPENDENT_AMBULATORY_CARE_PROVIDER_SITE_OTHER): Payer: Self-pay | Admitting: Primary Care

## 2021-03-28 VITALS — BP 108/70 | HR 79 | Temp 97.5°F | Ht 61.0 in | Wt 152.2 lb

## 2021-03-28 DIAGNOSIS — Z113 Encounter for screening for infections with a predominantly sexual mode of transmission: Secondary | ICD-10-CM

## 2021-03-28 DIAGNOSIS — Z1231 Encounter for screening mammogram for malignant neoplasm of breast: Secondary | ICD-10-CM

## 2021-03-28 DIAGNOSIS — Z124 Encounter for screening for malignant neoplasm of cervix: Secondary | ICD-10-CM | POA: Insufficient documentation

## 2021-03-28 NOTE — Progress Notes (Signed)
   WELL-WOMAN PAP Patient name: Priscilla Russell MRN [id number]  Date of birth: 1981-08-30 Chief Complaint:   Gynecologic Exam  History of Present Illness:   Priscilla Russell is a 40 y.o. No obstetric history on file. Caucasian female being seen today for a routine well-woman exam.  Current complaints: no  PCP: Grayce Sessions    does not desire labs Patient's last menstrual period was 02/25/2021 (approximate). The current method of family planning is none.  Last pap 2018. Results were: uknown  Last mammogram: none .  Review of Systems:   Pertinent items are noted in HPI Denies any headaches, blurred vision, fatigue, shortness of breath, chest pain, abdominal pain, abnormal vaginal discharge/itching/odor/irritation, problems with periods, bowel movements, urination, or intercourse unless otherwise stated above. Pertinent History Reviewed:  Reviewed past medical,surgical, social and family history.  Reviewed problem list, medications and allergies. Physical Assessment:   Vitals:   03/28/21 1438  BP: 108/70  Pulse: 79  Temp: (!) 97.5 F (36.4 C)  TempSrc: Temporal  SpO2: 96%  Weight: 152 lb 3.2 oz (69 kg)  Height: 5\' 1"  (1.549 m)  Body mass index is 28.76 kg/m.        Physical Examination:   General appearance - well appearing, and in no distress  Mental status - alert, oriented to person, place, and time  Psych:  She has a normal mood and affect  Skin - warm and dry, normal color, no suspicious lesions noted  Chest - effort normal, all lung fields clear to auscultation bilaterally  Heart - normal rate and regular rhythm  Neck:  midline trachea, no thyromegaly or nodules  Breasts - breasts appear normal, no suspicious masses, no skin or nipple changes or axillary nodes/ Taught SBE and returned demonstration   Abdomen - soft, nontender, nondistended, no masses or organomegaly  Pelvic - VULVA: normal appearing vulva with no masses, tenderness or lesions  VAGINA: normal  appearing vagina with normal color and discharge, no lesions  CERVIX: normal appearing cervix without discharge or lesions,  Thin prep pap is done UTERUS: uterus is felt to be normal size, shape, consistency and nontender   ADNEXA: No adnexal masses or tenderness noted.  Extremities:  No swelling or varicosities noted  No results found for this or any previous visit (from the past 24 hour(s)).  Assessment & Plan:  1) Well-Woman Exam with Pap  2) STD screening   3) breast cancer screening  Mammogram referral  or sooner if problems Patient completed application for [facility]  and  Return form to the office so it can be faxed to [facility] Patient had to leave to pick her son up from school therefore application was not completed in the office    Meds: No orders of the defined types were placed in this encounter.   Follow-up: Return if symptoms worsen or fail to improve.  Marland Kitchen  03/28/2021 8:52 PM

## 2021-03-29 LAB — CERVICOVAGINAL ANCILLARY ONLY
Bacterial Vaginitis (gardnerella): POSITIVE — AB
Candida Glabrata: NEGATIVE
Candida Vaginitis: NEGATIVE
Chlamydia: NEGATIVE
Comment: NEGATIVE
Comment: NEGATIVE
Comment: NEGATIVE
Comment: NEGATIVE
Comment: NEGATIVE
Comment: NORMAL
Neisseria Gonorrhea: NEGATIVE
Trichomonas: NEGATIVE

## 2021-03-30 ENCOUNTER — Other Ambulatory Visit (INDEPENDENT_AMBULATORY_CARE_PROVIDER_SITE_OTHER): Payer: Self-pay | Admitting: Primary Care

## 2021-03-30 DIAGNOSIS — N760 Acute vaginitis: Secondary | ICD-10-CM

## 2021-03-30 DIAGNOSIS — B9689 Other specified bacterial agents as the cause of diseases classified elsewhere: Secondary | ICD-10-CM

## 2021-03-30 LAB — CYTOLOGY - PAP
Adequacy: ABSENT
Diagnosis: NEGATIVE

## 2021-03-30 MED ORDER — METRONIDAZOLE 500 MG PO TABS: 500.0000 mg | ORAL_TABLET | Freq: Two times a day (BID) | ORAL | 0 refills | Status: DC

## 2021-04-04 ENCOUNTER — Ambulatory Visit (INDEPENDENT_AMBULATORY_CARE_PROVIDER_SITE_OTHER): Payer: Self-pay

## 2021-04-04 NOTE — Telephone Encounter (Signed)
Patient called and says she's been having bilateral ankle swelling for the past 3 days, the size of an orange. She says there is a little pain. Denies any other symptoms. She asks about having her kidney labs checked to see if that is causing the swelling. I advised no availability in the office. Called the office and spoke to [facility] who advised the mobile bus, location address/hours for tomorrow. I advised the patient, she verbalized understanding, care advice given.    Reason for Disposition . Swollen ankle joint  (Exception: area of localized swelling which is itchy)  Answer Assessment - Initial Assessment Questions 1. LOCATION: "Which ankle is swollen?" "Where is the swelling?"     Both ankles 2. ONSET: "When did the swelling start?"     3 days ago 3. SIZE: "How large is the swelling?"     Size of an orange 4. PAIN: "Is there any pain?" If Yes, ask: "How bad is it?" (Scale 1-10; or mild, moderate, severe)   - NONE (0): no pain.   - MILD (1-3): doesn't interfere with normal activities.    - MODERATE (4-7): interferes with normal activities (e.g., work or school) or awakens from sleep, limping.    - SEVERE (8-10): excruciating pain, unable to do any normal activities, unable to walk.      Mild 5. CAUSE: "What do you think caused the ankle swelling?"     I don't know 6. OTHER SYMPTOMS: "Do you have any other symptoms?" (e.g., fever, chest pain, difficulty breathing, calf pain)     No 7. PREGNANCY: "Is there any chance you are pregnant?" "When was your last menstrual period?"     No  Protocols used: ANKLE SWELLING-A-AH

## 2021-04-05 ENCOUNTER — Other Ambulatory Visit: Payer: Self-pay

## 2021-04-05 ENCOUNTER — Ambulatory Visit: Payer: Self-pay | Admitting: Physician Assistant

## 2021-04-05 VITALS — BP 105/75 | HR 60 | Temp 98.2°F | Resp 18 | Ht 61.0 in | Wt 151.0 lb

## 2021-04-05 DIAGNOSIS — Z13220 Encounter for screening for lipoid disorders: Secondary | ICD-10-CM

## 2021-04-05 DIAGNOSIS — E559 Vitamin D deficiency, unspecified: Secondary | ICD-10-CM

## 2021-04-05 DIAGNOSIS — R600 Localized edema: Secondary | ICD-10-CM

## 2021-04-05 DIAGNOSIS — R7303 Prediabetes: Secondary | ICD-10-CM

## 2021-04-05 NOTE — Patient Instructions (Signed)
I encourage you to work on drinking 64 ounces of water a day, eliminating sugar drinks from your diet.  I also encourage you to work on reducing your smoking and eating a low sodium diet.  All of these things together will help reduce episodes of swelling and will help your overall health, including your blood sugar levels.  We will call you with your lab results.  Roney Jaffe, PA-C Physician Assistant [facility] [email or web address]    Edema  Edema is an abnormal buildup of fluids in the body tissues and under the skin. Swelling of the legs, feet, and ankles is a common symptom that becomes more likely as you get older. Swelling is also common in looser tissues, like around the eyes. When the affected area is squeezed, the fluid may move out of that spot and leave a dent for a few moments. This dent is called pitting edema. There are many possible causes of edema. Eating too much salt (sodium) and being on your feet or sitting for a long time can cause edema in your legs, feet, and ankles. Hot weather may make edema worse. Common causes of edema include:  Heart failure.  Liver or kidney disease.  Weak leg blood vessels.  Cancer.  An injury.  Pregnancy.  Medicines.  Being obese.  Low protein levels in the blood. Edema is usually painless. Your skin may look swollen or shiny. Follow these instructions at home:  Keep the affected body part raised (elevated) above the level of your heart when you are sitting or lying down.  Do not sit still or stand for long periods of time.  Do not wear tight clothing. Do not wear garters on your upper legs.  Exercise your legs to get your circulation going. This helps to move the fluid back into your blood vessels, and it may help the swelling go down.  Wear elastic bandages or support stockings to reduce swelling as told by your health care provider.  Eat a low-salt (low-sodium)  diet to reduce fluid as told by your health care provider.  Depending on the cause of your swelling, you may need to limit how much fluid you drink (fluid restriction).  Take over-the-counter and prescription medicines only as told by your health care provider. Contact a health care provider if:  Your edema does not get better with treatment.  You have heart, liver, or kidney disease and have symptoms of edema.  You have sudden and unexplained weight gain. Get help right away if:  You develop shortness of breath or chest pain.  You cannot breathe when you lie down.  You develop pain, redness, or warmth in the swollen areas.  You have heart, liver, or kidney disease and suddenly get edema.  You have a fever and your symptoms suddenly get worse. Summary  Edema is an abnormal buildup of fluids in the body tissues and under the skin.  Eating too much salt (sodium) and being on your feet or sitting for a long time can cause edema in your legs, feet, and ankles.  Keep the affected body part raised (elevated) above the level of your heart when you are sitting or lying down. This information is not intended to replace advice given to you by your health care provider. Make sure you discuss any questions you have with your health care provider. Document Revised: 04/30/2019 Document Reviewed: 01/13/2017 Elsevier Patient Education  2021 [employer].

## 2021-04-05 NOTE — Progress Notes (Signed)
Established Patient Office Visit  Subjective:  Patient ID: Priscilla Russell, female    DOB: 03/17/1981  Age: 40 y.o. MRN: [id number]  CC:  Chief Complaint  Patient presents with  . Joint Swelling    bilateral    HPI Priscilla Russell reports that she has been having bilateral ankle swelling for the last 3 days.  Does endorse that she believes it is improving.  Reports that she has been keeping her feet elevated as much as possible with some relief.  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  Reports that she does suffer from migraines that occur right before her menstrual cycle, states that Friday was the last day of her menstrual cycle, which also was the same day her ankle swelling began.  States that she does not drink any water, states that she feels it makes her nauseous, does endorse a high sodium diet with lots of fast food.   History reviewed. No pertinent past medical history.  Past Surgical History:  Procedure Laterality Date  . TUBAL LIGATION      History reviewed. No pertinent family history.  Social History   Socioeconomic History  . Marital status: Single    Spouse name: Not on file  . Number of children: Not on file  . Years of education: Not on file  . Highest education level: Not on file  Occupational History  . Not on file  Tobacco Use  . Smoking status: Current Every Day Smoker    Packs/day: 0.50    Types: Cigars  . Smokeless tobacco: Never Used  Substance and Sexual Activity  . Alcohol use: No  . Drug use: No  . Sexual activity: Yes  Other Topics Concern  . Not on file  Social History Narrative  . Not on file   Social Determinants of Health   Financial Resource Strain: Not on file  Food Insecurity: Not on file  Transportation Needs: Not on file  Physical Activity: Not on file  Stress: Not on file  Social Connections: Not on file  Intimate Partner Violence: Not on file    Outpatient Medications Prior to Visit   Medication Sig Dispense Refill  . ibuprofen (ADVIL) 600 MG tablet Take 1 tablet (600 mg total) by mouth every 8 (eight) hours as needed. 60 tablet 1  . metroNIDAZOLE (FLAGYL) 500 MG tablet Take 1 tablet (500 mg total) by mouth 2 (two) times daily. 14 tablet 0   No facility-administered medications prior to visit.    No Known Allergies  ROS Review of Systems  Constitutional: Negative.   HENT: Negative.   Eyes: Negative.   Respiratory: Negative for shortness of breath and wheezing.   Cardiovascular: Positive for leg swelling. Negative for chest pain and palpitations.  Gastrointestinal: Negative.   Endocrine: Negative.   Genitourinary: Negative.   Musculoskeletal: Negative.   Skin: Negative.   Allergic/Immunologic: Negative.   Neurological: Negative for dizziness and weakness.  Hematological: Negative.   Psychiatric/Behavioral: Negative.       Objective:    Physical Exam Vitals and nursing note reviewed.  Constitutional:      General: She is not in acute distress.    Appearance: Normal appearance. She is not ill-appearing.  HENT:     Head: Normocephalic and atraumatic.     Right Ear: External ear normal.     Left Ear: External ear normal.     Nose: Nose normal.     Mouth/Throat:  Mouth: Mucous membranes are moist.     Pharynx: Oropharynx is clear.  Eyes:     Extraocular Movements: Extraocular movements intact.     Conjunctiva/sclera: Conjunctivae normal.     Pupils: Pupils are equal, round, and reactive to light.  Cardiovascular:     Rate and Rhythm: Normal rate and regular rhythm.     Pulses: Normal pulses.     Heart sounds: Normal heart sounds.  Pulmonary:     Effort: Pulmonary effort is normal.     Breath sounds: Normal breath sounds.  Musculoskeletal:     Cervical back: Normal range of motion and neck supple.     Right lower leg: 1+ Edema present.     Left lower leg: 1+ Edema present.  Skin:    General: Skin is warm and dry.  Neurological:      General: No focal deficit present.     Mental Status: She is alert and oriented to person, place, and time.  Psychiatric:        Mood and Affect: Mood normal.        Behavior: Behavior normal.        Thought Content: Thought content normal.        Judgment: Judgment normal.     BP 105/75 (BP Location: Left Arm, Patient Position: Sitting, Cuff Size: Normal)   Pulse 60   Temp 98.2 F (36.8 C) (Oral)   Resp 18   Ht 5\' 1"  (1.549 m)   Wt 151 lb (68.5 kg)   LMP 04/01/2021   SpO2 100%   BMI 28.53 kg/m  Wt Readings from Last 3 Encounters:  04/05/21 151 lb (68.5 kg)  03/28/21 152 lb 3.2 oz (69 kg)  03/22/21 151 lb 6.4 oz (68.7 kg)     There are no preventive care reminders to display for this patient.  There are no preventive care reminders to display for this patient.  Lab Results  Component Value Date   TSH 0.930 04/05/2021   Lab Results  Component Value Date   WBC 5.8 04/05/2021   HGB 12.9 04/05/2021   HCT 41.4 04/05/2021   MCV 86 04/05/2021   PLT 254 04/05/2021   Lab Results  Component Value Date   NA 141 04/05/2021   K 3.9 04/05/2021   CO2 22 07/10/2017   GLUCOSE 86 04/05/2021   BUN 8 04/05/2021   CREATININE 0.76 04/05/2021   BILITOT 0.9 04/05/2021   ALKPHOS 48 04/05/2021   AST 18 04/05/2021   ALT 14 07/10/2017   PROT 5.7 (L) 04/05/2021   ALBUMIN 3.6 (L) 04/05/2021   CALCIUM 8.4 (L) 04/05/2021   Lab Results  Component Value Date   CHOL 172 04/05/2021   Lab Results  Component Value Date   HDL 51 04/05/2021   Lab Results  Component Value Date   LDLCALC 109 (H) 04/05/2021   Lab Results  Component Value Date   TRIG 61 04/05/2021   Lab Results  Component Value Date   CHOLHDL 3.4 04/05/2021   Lab Results  Component Value Date   HGBA1C 5.7 (A) 03/22/2021      Assessment & Plan:   Problem List Items Addressed This Visit      Other   Prediabetes   Relevant Orders   Vitamin D, 25-hydroxy (Completed)    Other Visit Diagnoses     Localized edema    -  Primary   Relevant Orders   CBC with Differential/Platelet (Completed)   Comp. Metabolic Panel (12) (Completed)  TSH (Completed)   Screening for lipid disorders       Relevant Orders   Lipid panel (Completed)    1. Localized edema Patient education given, encouraged patient to increase hydration, lower sodium, keep feet elevated when able, light compression stockings.  Red flags given for prompt reevaluation - CBC with Differential/Platelet - Comp. Metabolic Panel (12) - TSH  2. Prediabetes A1c 5.7 at recent physical.  Patient encouraged to work on low sugar diet - Vitamin D, 25-hydroxy  3. Screening for lipid disorders  - Lipid panel    I have reviewed the patient's medical history (PMH, PSH, Social History, Family History, Medications, and allergies) , and have been updated if relevant. I spent 30 minutes reviewing chart and  face to face time with patient.     No orders of the defined types were placed in this encounter.   Follow-up: Return if symptoms worsen or fail to improve.    Kasandra Knudsen Mayers, PA-C

## 2021-04-05 NOTE — Progress Notes (Signed)
Patient verified DOB Patient complains of bilateral ankle swelling for the past 3 days. Patient shares elevation of the legs helped with swelling. Patient has never experienced swelling prior to this . 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

## 2021-04-06 DIAGNOSIS — R600 Localized edema: Secondary | ICD-10-CM | POA: Insufficient documentation

## 2021-04-06 DIAGNOSIS — E559 Vitamin D deficiency, unspecified: Secondary | ICD-10-CM | POA: Insufficient documentation

## 2021-04-06 LAB — COMP. METABOLIC PANEL (12)
AST: 18 IU/L (ref 0–40)
Albumin/Globulin Ratio: 1.7 (ref 1.2–2.2)
Albumin: 3.6 g/dL — ABNORMAL LOW (ref 3.8–4.8)
Alkaline Phosphatase: 48 IU/L (ref 44–121)
BUN/Creatinine Ratio: 11 (ref 9–23)
BUN: 8 mg/dL (ref 6–24)
Bilirubin Total: 0.9 mg/dL (ref 0.0–1.2)
Calcium: 8.4 mg/dL — ABNORMAL LOW (ref 8.7–10.2)
Chloride: 105 mmol/L (ref 96–106)
Creatinine, Ser: 0.76 mg/dL (ref 0.57–1.00)
Globulin, Total: 2.1 g/dL (ref 1.5–4.5)
Glucose: 86 mg/dL (ref 65–99)
Potassium: 3.9 mmol/L (ref 3.5–5.2)
Sodium: 141 mmol/L (ref 134–144)
Total Protein: 5.7 g/dL — ABNORMAL LOW (ref 6.0–8.5)
eGFR: 100 mL/min/{1.73_m2} (ref >59)

## 2021-04-06 LAB — CBC WITH DIFFERENTIAL/PLATELET
Basophils Absolute: 0 10*3/uL (ref 0.0–0.2)
Basos: 1 %
EOS (ABSOLUTE): 0.1 10*3/uL (ref 0.0–0.4)
Eos: 1 %
Hematocrit: 41.4 % (ref 34.0–46.6)
Hemoglobin: 12.9 g/dL (ref 11.1–15.9)
Immature Grans (Abs): 0 10*3/uL (ref 0.0–0.1)
Immature Granulocytes: 0 %
Lymphocytes Absolute: 1.9 10*3/uL (ref 0.7–3.1)
Lymphs: 33 %
MCH: 26.8 pg (ref 26.6–33.0)
MCHC: 31.2 g/dL — ABNORMAL LOW (ref 31.5–35.7)
MCV: 86 fL (ref 79–97)
Monocytes Absolute: 0.3 10*3/uL (ref 0.1–0.9)
Monocytes: 5 %
Neutrophils Absolute: 3.5 10*3/uL (ref 1.4–7.0)
Neutrophils: 60 %
Platelets: 254 10*3/uL (ref 150–450)
RBC: 4.81 x10E6/uL (ref 3.77–5.28)
RDW: 13.1 % (ref 11.7–15.4)
WBC: 5.8 10*3/uL (ref 3.4–10.8)

## 2021-04-06 LAB — LIPID PANEL
Chol/HDL Ratio: 3.4 ratio (ref 0.0–4.4)
Cholesterol, Total: 172 mg/dL (ref 100–199)
HDL: 51 mg/dL (ref >39)
LDL Chol Calc (NIH): 109 mg/dL — ABNORMAL HIGH (ref 0–99)
Triglycerides: 61 mg/dL (ref 0–149)
VLDL Cholesterol Cal: 12 mg/dL (ref 5–40)

## 2021-04-06 LAB — TSH: TSH: 0.93 u[IU]/mL (ref 0.450–4.500)

## 2021-04-06 LAB — VITAMIN D 25 HYDROXY (VIT D DEFICIENCY, FRACTURES): Vit D, 25-Hydroxy: 11.8 ng/mL — ABNORMAL LOW (ref 30.0–100.0)

## 2021-04-06 MED ORDER — VITAMIN D (ERGOCALCIFEROL) 1.25 MG (50000 UNIT) PO CAPS: 50000.0000 [IU] | ORAL_CAPSULE | ORAL | 2 refills | Status: DC

## 2021-04-06 NOTE — Addendum Note (Signed)
Addended by: Roney Jaffe on: 04/06/2021 12:05 PM   Modules accepted: Orders

## 2021-04-12 ENCOUNTER — Telehealth: Payer: Self-pay | Admitting: *Deleted

## 2021-04-12 NOTE — Telephone Encounter (Signed)
Patient verified DOB Patient is aware of normal results except for signs of dehydration, LDL elevation and vitamin d deficiency. Patient has picked up vitamin d and will follow a low cholesterol diet.  Patient will have levels rechecked in 3 months with primary of [facility], no other concerns.

## 2021-04-12 NOTE — Telephone Encounter (Signed)
-----   Message from Roney Jaffe, [place] sent at 04/06/2021 12:05 PM EDT ----- Please call patient and let her know that her thyroid function, kidney and liver function are within normal limits, she does show signs of dehydration.  She does not show signs of anemia.  Her cholesterol overall is within normal limits, however her LDL is slightly elevated, she should follow a low-cholesterol diet.  Her vitamin D is very low, she needs to take 50,000 units once a week for the next 12 weeks and have it rechecked at that time.  Prescription sent to her pharmacy

## 2021-06-13 ENCOUNTER — Other Ambulatory Visit: Payer: Self-pay | Admitting: Primary Care

## 2021-06-13 DIAGNOSIS — Z1231 Encounter for screening mammogram for malignant neoplasm of breast: Secondary | ICD-10-CM

## 2022-11-29 ENCOUNTER — Ambulatory Visit (INDEPENDENT_AMBULATORY_CARE_PROVIDER_SITE_OTHER): Payer: Self-pay | Admitting: Primary Care

## 2022-11-29 DIAGNOSIS — R21 Rash and other nonspecific skin eruption: Secondary | ICD-10-CM

## 2022-11-29 DIAGNOSIS — Z2821 Immunization not carried out because of patient refusal: Secondary | ICD-10-CM

## 2022-11-29 MED ORDER — TRIAMCINOLONE ACETONIDE 0.1 % EX CREA: TOPICAL_CREAM | Freq: Two times a day (BID) | CUTANEOUS | 1 refills | Status: DC

## 2022-11-29 NOTE — Progress Notes (Signed)
[place], is a 41 y.o. female  [id number]  [id number]  DOB - Nov 28, 1981  Chief Complaint  Patient presents with   Rash    Right leg Started June with a cut  Not healing  She has been using ringworm treatments        Subjective:   [name] is a 41 y.o. female here today for concerns with a cut on her right leg in general that is not healing and she feels like it is a ringworm and has been treating it with antifungal medication with no resolve it is pruritus.  Thi visit. Patient has No headache, No chest pain, No abdominal pain - No Nausea, No new weakness tingling or numbness, No Cough - shortness of breath  No problems updated.  No Known Allergies  No past medical history on file.  Current Outpatient Medications on File Prior to Visit  Medication Sig Dispense Refill   ibuprofen (ADVIL) 600 MG tablet Take 1 tablet (600 mg total) by mouth every 8 (eight) hours as needed. (Patient not taking: Reported on 11/29/2022) 60 tablet 1   metroNIDAZOLE (FLAGYL) 500 MG tablet Take 1 tablet (500 mg total) by mouth 2 (two) times daily. (Patient not taking: Reported on 11/29/2022) 14 tablet 0   Vitamin D, Ergocalciferol, (DRISDOL) 1.25 MG (50000 UNIT) CAPS capsule Take 1 capsule (50,000 Units total) by mouth every 7 (seven) days. (Patient not taking: Reported on 11/29/2022) 4 capsule 2   No current facility-administered medications on file prior to visit.    Objective:  There were no vitals filed for this visit.There were no vitals taken for this visit.  Exam General appearance : Awake, alert, not in any distress. Speech Clear. Not toxic looking HEENT: Atraumatic and Normocephalic, pupils equally reactive to light and accomodation Neck: Supple, no JVD. No cervical lymphadenopathy.  Chest: Good air entry bilaterally, no added sounds  CVS: S1 S2 regular, no murmurs.  Abdomen: Bowel sounds present, Non tender and not distended with no gaurding,  rigidity or rebound. Extremities: B/L Lower Ext shows no edema, both legs are warm to touch Neurology: Awake alert, and oriented X 3, CN II-XII intact, Non focal Skin: See picture below Data Review Lab Results  Component Value Date   HGBA1C 5.7 (A) 03/22/2021    Assessment & Plan  Priscilla Russell was seen today for rash.  Diagnoses and all orders for this visit:  Influenza vaccination declined by patient  Rash and nonspecific skin eruption  -     Ambulatory referral to Dermatology  Other orders -     ketoconazole 2%-triamcinolone 0.1% 1:2 cream mixture; Apply topically 2 (two) times daily.    There are no diagnoses linked to this encounter.   Patient have been counseled extensively about nutrition and exercise. Other issues discussed during this visit include: low cholesterol diet, weight control and daily exercise, foot care, annual eye examinations at Ophthalmology, importance of adherence with medications and regular follow-up. We also discussed long term complications of uncontrolled diabetes and hypertension.   Keep scheduled appt  The patient was given clear instructions to go to ER or return to medical center if symptoms don't improve, worsen or new problems develop. The patient verbalized understanding. The patient was told to call to get lab results if they haven't heard anything in the next week.   This note has been created with Surveyor, quantity. Any transcriptional errors are unintentional.   Kerin Perna, NP  11/29/2022, 2:13 PM

## 2022-12-03 ENCOUNTER — Encounter (INDEPENDENT_AMBULATORY_CARE_PROVIDER_SITE_OTHER): Payer: Self-pay | Admitting: Primary Care

## 2022-12-04 ENCOUNTER — Ambulatory Visit (INDEPENDENT_AMBULATORY_CARE_PROVIDER_SITE_OTHER): Payer: Commercial Managed Care - HMO | Admitting: Primary Care

## 2022-12-04 ENCOUNTER — Encounter (INDEPENDENT_AMBULATORY_CARE_PROVIDER_SITE_OTHER): Payer: Self-pay | Admitting: Primary Care

## 2022-12-04 VITALS — BP 107/71 | HR 72 | Resp 16 | Ht 61.0 in | Wt 157.4 lb

## 2022-12-04 DIAGNOSIS — E559 Vitamin D deficiency, unspecified: Secondary | ICD-10-CM

## 2022-12-04 DIAGNOSIS — Z0000 Encounter for general adult medical examination without abnormal findings: Secondary | ICD-10-CM

## 2022-12-04 DIAGNOSIS — E785 Hyperlipidemia, unspecified: Secondary | ICD-10-CM

## 2022-12-04 DIAGNOSIS — R7303 Prediabetes: Secondary | ICD-10-CM

## 2022-12-04 DIAGNOSIS — E663 Overweight: Secondary | ICD-10-CM | POA: Diagnosis not present

## 2022-12-04 DIAGNOSIS — Z1159 Encounter for screening for other viral diseases: Secondary | ICD-10-CM

## 2022-12-04 NOTE — Progress Notes (Signed)
  [facility]  WELL-WOMAN PHYSICALPatient name: Priscilla Russell MRN [id number]  Date of birth: [birth year] Chief Complaint:   Annual Exam  History of Present Illness:   Priscilla Russell is a 41 y.o. No obstetric history on file. female being seen today for a routine  exam.   XB:[id number]  Patient's last menstrual period was 11/30/2022.  Review of Systems:    Denies any headaches, blurred vision, fatigue, shortness of breath, chest pain, abdominal pain, abnormal vaginal discharge/itching/odor/irritation, problems with periods, bowel movements, urination, or intercourse unless otherwise stated above.  Pertinent History Reviewed:   Reviewed past medical,surgical, social and family history.  Reviewed problem list, medications and allergies.  Physical Assessment:   Vitals:   12/04/22 1025  BP: 107/71  Pulse: 72  Resp: 16  SpO2: 100%  Weight: 157 lb 6.4 oz (71.4 kg)  Height: 5\' 1"  (1.549 m)  Body mass index is 29.74 kg/m.        Physical Examination:  General appearance - well appearing, and in no distress Mental status - alert, oriented to person, place, and time Psych:  She has a normal mood and affect Skin - warm and dry, normal color, no suspicious lesions noted Chest - effort normal, all lung fields clear to auscultation bilaterally Heart - normal rate and regular rhythm Neck:  midline trachea, no thyromegaly or nodules Abdomen - soft, nontender, nondistended, no masses or organomegaly Extremities:  No swelling or varicosities noted  No results found for this or any previous visit (from the past 24 hour(s)).   Assessment & Plan:  Priscilla Russell was seen today for annual exam.  Diagnoses and all orders for this visit:  Annual physical exam  Overweight (BMI 25.0-29.9) Discussed diet and exercise for person with BMI >25. Instructed: You must burn more calories than you eat. Losing 5 percent of your body weight should be considered a success. In the longer term,  losing more than 15 percent of your body weight and staying at this weight is an extremely good result. However, keep in mind that even losing 5 percent of your body weight leads to important health benefits, so try not to get discouraged if you're not able to lose more than this.  Encounter for hepatitis C screening test for low risk patient -     HCV Ab w Reflex to Quant PCR  Vitamin D deficiency Vitamin D is needed to make and keep bones strong.  -     Vitamin D, 25-hydroxy  Dyslipidemia, goal LDL below 70  Healthy lifestyle diet of fruits vegetables fish nuts whole grains and low saturated fat . Foods high in cholesterol or liver, fatty meats,cheese, butter avocados, nuts and seeds, chocolate and fried foods. -     Lipid Panel  Prediabetes - educated on lifestyle modifications, including but not limited to diet choices and adding exercise to daily routine.   -     CBC with Differential -     Hemoglobin A1c   Follow-up: Return if symptoms worsen or fail to improve.  This note has been created with 10-27-2001. Any transcriptional errors are unintentional.   Education officer, environmental, NP 12/04/2022, 4:29 PM

## 2022-12-05 LAB — CBC WITH DIFFERENTIAL/PLATELET
Basophils Absolute: 0.1 10*3/uL (ref 0.0–0.2)
Basos: 1 %
EOS (ABSOLUTE): 0.1 10*3/uL (ref 0.0–0.4)
Eos: 1 %
Hematocrit: 37.8 % (ref 34.0–46.6)
Hemoglobin: 12.4 g/dL (ref 11.1–15.9)
Immature Grans (Abs): 0 10*3/uL (ref 0.0–0.1)
Immature Granulocytes: 0 %
Lymphocytes Absolute: 1.8 10*3/uL (ref 0.7–3.1)
Lymphs: 29 %
MCH: 27.3 pg (ref 26.6–33.0)
MCHC: 32.8 g/dL (ref 31.5–35.7)
MCV: 83 fL (ref 79–97)
Monocytes Absolute: 0.3 10*3/uL (ref 0.1–0.9)
Monocytes: 5 %
Neutrophils Absolute: 4 10*3/uL (ref 1.4–7.0)
Neutrophils: 64 %
Platelets: 284 10*3/uL (ref 150–450)
RBC: 4.54 x10E6/uL (ref 3.77–5.28)
RDW: 13.2 % (ref 11.7–15.4)
WBC: 6.2 10*3/uL (ref 3.4–10.8)

## 2022-12-05 LAB — LIPID PANEL
Chol/HDL Ratio: 3.5 ratio (ref 0.0–4.4)
Cholesterol, Total: 145 mg/dL (ref 100–199)
HDL: 42 mg/dL (ref >39)
LDL Chol Calc (NIH): 85 mg/dL (ref 0–99)
Triglycerides: 93 mg/dL (ref 0–149)
VLDL Cholesterol Cal: 18 mg/dL (ref 5–40)

## 2022-12-05 LAB — CMP14+EGFR
ALT: 7 IU/L (ref 0–32)
AST: 14 IU/L (ref 0–40)
Albumin/Globulin Ratio: 1.8 (ref 1.2–2.2)
Albumin: 4 g/dL (ref 3.9–4.9)
Alkaline Phosphatase: 59 IU/L (ref 44–121)
BUN/Creatinine Ratio: 13 (ref 9–23)
BUN: 10 mg/dL (ref 6–24)
Bilirubin Total: 0.4 mg/dL (ref 0.0–1.2)
CO2: 24 mmol/L (ref 20–29)
Calcium: 8.9 mg/dL (ref 8.7–10.2)
Chloride: 105 mmol/L (ref 96–106)
Creatinine, Ser: 0.79 mg/dL (ref 0.57–1.00)
Globulin, Total: 2.2 g/dL (ref 1.5–4.5)
Glucose: 78 mg/dL (ref 70–99)
Potassium: 4.2 mmol/L (ref 3.5–5.2)
Sodium: 142 mmol/L (ref 134–144)
Total Protein: 6.2 g/dL (ref 6.0–8.5)
eGFR: 95 mL/min/{1.73_m2} (ref >59)

## 2022-12-05 LAB — VITAMIN D 25 HYDROXY (VIT D DEFICIENCY, FRACTURES): Vit D, 25-Hydroxy: 15.7 ng/mL — ABNORMAL LOW (ref 30.0–100.0)

## 2022-12-05 LAB — HCV AB W REFLEX TO QUANT PCR: HCV Ab: NONREACTIVE

## 2022-12-05 LAB — HEMOGLOBIN A1C
Est. average glucose Bld gHb Est-mCnc: 126 mg/dL
Hgb A1c MFr Bld: 6 % — ABNORMAL HIGH (ref 4.8–5.6)

## 2022-12-05 LAB — HCV INTERPRETATION

## 2023-08-06 ENCOUNTER — Encounter (HOSPITAL_COMMUNITY): Payer: Self-pay | Admitting: *Deleted

## 2023-08-06 ENCOUNTER — Ambulatory Visit (HOSPITAL_COMMUNITY)
Admission: EM | Admit: 2023-08-06 | Discharge: 2023-08-06 | Disposition: A | Discharge status: Home / Self Care | Payer: Commercial Managed Care - HMO | Attending: Family Medicine | Admitting: Family Medicine

## 2023-08-06 ENCOUNTER — Other Ambulatory Visit: Payer: Self-pay

## 2023-08-06 DIAGNOSIS — R509 Fever, unspecified: Secondary | ICD-10-CM | POA: Diagnosis present

## 2023-08-06 DIAGNOSIS — F17210 Nicotine dependence, cigarettes, uncomplicated: Secondary | ICD-10-CM | POA: Diagnosis not present

## 2023-08-06 DIAGNOSIS — U071 COVID-19: Secondary | ICD-10-CM | POA: Diagnosis not present

## 2023-08-06 DIAGNOSIS — R059 Cough, unspecified: Secondary | ICD-10-CM | POA: Diagnosis present

## 2023-08-06 DIAGNOSIS — J069 Acute upper respiratory infection, unspecified: Secondary | ICD-10-CM

## 2023-08-06 MED ORDER — ACETAMINOPHEN 325 MG PO TABS
ORAL_TABLET | ORAL | Status: AC
  Filled 2023-08-06: qty 3

## 2023-08-06 MED ORDER — ACETAMINOPHEN 325 MG PO TABS: 975.0000 mg | ORAL_TABLET | Freq: Once | ORAL | Status: DC

## 2023-08-06 MED ORDER — HYDROCODONE BIT-HOMATROP MBR 5-1.5 MG/5ML PO SOLN: 5.0000 mL | Freq: Four times a day (QID) | ORAL | 0 refills | Status: AC | PRN

## 2023-08-06 NOTE — Discharge Instructions (Signed)
Be aware, your cough medication may cause drowsiness. Please do not drive, operate heavy machinery or make important decisions while on this medication, it can cloud your judgement.  You have been tested for COVID-19 today. If your test returns positive, you will receive a phone call from [facility] regarding your results. Negative test results are not called. Both positive and negative results area always visible on MyChart. If you do not have a MyChart account, sign up instructions are provided in your discharge papers. Please do not hesitate to contact us should you have questions or concerns.  

## 2023-08-06 NOTE — ED Triage Notes (Signed)
Pt reports Sx's started 2 days ago. Pt has cough,fever,sore throat and body aches.

## 2023-08-06 NOTE — ED Provider Notes (Signed)
  [facility]   [id number] 08/06/23 Arrival Time: 0931  ASSESSMENT & PLAN:  1. Viral URI with cough    Discussed typical duration of likely viral illness. COVID testing sent at pt request.  Work note provided. No resp distress. OTC symptom care as needed.  New Prescriptions   HYDROCODONE BIT-HOMATROPINE (HYCODAN) 5-1.5 MG/5ML SYRUP    Take 5 mLs by mouth every 6 (six) hours as needed for cough.     Follow-up Information     [facility].   Specialty: Urgent Care Why: If worsening or failing to improve as anticipated. Contact information: [address] [place] [address] [phone]                Reviewed expectations re: course of current medical issues. Questions answered. Outlined signs and symptoms indicating need for more acute intervention. Understanding verbalized. After Visit Summary given.   SUBJECTIVE: History from: Patient. Priscilla Russell is a 42 y.o. female. Reports: cough, subj fever, ST, body aches, fatigue. Day #3. Denies: difficulty breathing. Normal PO intake without n/v/d.  OBJECTIVE:  Vitals:   08/06/23 1102  BP: 121/67  Pulse: 61  Resp: 18  Temp: 99.2 F (37.3 C)  SpO2: 95%    General appearance: alert; no distress but appears fatigued Eyes: PERRLA; EOMI; conjunctiva normal HENT: [place]; AT; with nasal congestion Neck: supple  Lungs: speaks full sentences without difficulty; unlabored Extremities: no edema Skin: warm and dry Neurologic: normal gait Psychological: alert and cooperative; normal mood and affect  Labs:  Labs Reviewed  SARS CORONAVIRUS 2 (TAT 6-24 HRS)     No Known Allergies  History reviewed. No pertinent past medical history. Social History   Socioeconomic History   Marital status: Single    Spouse name: Not on file   Number of children: Not on file   Years of education: Not on file   Highest education level: Not on file  Occupational History    Not on file  Tobacco Use   Smoking status: Every Day    Current packs/day: 0.50    Types: Cigars, Cigarettes   Smokeless tobacco: Never  Substance and Sexual Activity   Alcohol use: No   Drug use: No   Sexual activity: Yes  Other Topics Concern   Not on file  Social History Narrative   Not on file   Social Determinants of Health   Financial Resource Strain: Not on file  Food Insecurity: Not on file  Transportation Needs: Not on file  Physical Activity: Not on file  Stress: Not on file  Social Connections: Not on file  Intimate Partner Violence: Not on file   History reviewed. No pertinent family history. Past Surgical History:  Procedure Laterality Date   TUBAL LIGATION       Mardella Layman, MD 08/06/23 [phone]

## 2023-08-28 ENCOUNTER — Emergency Department (HOSPITAL_COMMUNITY): Payer: Commercial Managed Care - HMO

## 2023-08-28 ENCOUNTER — Other Ambulatory Visit: Payer: Self-pay

## 2023-08-28 ENCOUNTER — Emergency Department (HOSPITAL_COMMUNITY)
Admission: EM | Admit: 2023-08-28 | Discharge: 2023-08-29 | Disposition: A | Discharge status: Home / Self Care | Payer: Commercial Managed Care - HMO | Attending: Emergency Medicine | Admitting: Emergency Medicine

## 2023-08-28 ENCOUNTER — Encounter (HOSPITAL_COMMUNITY): Payer: Self-pay | Admitting: Emergency Medicine

## 2023-08-28 DIAGNOSIS — M545 Low back pain, unspecified: Secondary | ICD-10-CM | POA: Insufficient documentation

## 2023-08-28 DIAGNOSIS — E876 Hypokalemia: Secondary | ICD-10-CM | POA: Diagnosis not present

## 2023-08-28 DIAGNOSIS — Z1152 Encounter for screening for COVID-19: Secondary | ICD-10-CM | POA: Insufficient documentation

## 2023-08-28 LAB — HCG, QUANTITATIVE, PREGNANCY: hCG, Beta Chain, Quant, S: <1 m[IU]/mL (ref <5)

## 2023-08-28 LAB — CBC WITH DIFFERENTIAL/PLATELET
Abs Immature Granulocytes: 0.02 10*3/uL (ref 0.00–0.07)
Basophils Absolute: 0 10*3/uL (ref 0.0–0.1)
Basophils Relative: 0 %
Eosinophils Absolute: 0.1 10*3/uL (ref 0.0–0.5)
Eosinophils Relative: 1 %
HCT: 37.1 % (ref 36.0–46.0)
Hemoglobin: 12.1 g/dL (ref 12.0–15.0)
Immature Granulocytes: 0 %
Lymphocytes Relative: 26 %
Lymphs Abs: 2.3 10*3/uL (ref 0.7–4.0)
MCH: 27.3 pg (ref 26.0–34.0)
MCHC: 32.6 g/dL (ref 30.0–36.0)
MCV: 83.7 fL (ref 80.0–100.0)
Monocytes Absolute: 0.5 10*3/uL (ref 0.1–1.0)
Monocytes Relative: 6 %
Neutro Abs: 6 10*3/uL (ref 1.7–7.7)
Neutrophils Relative %: 67 %
Platelets: 277 10*3/uL (ref 150–400)
RBC: 4.43 MIL/uL (ref 3.87–5.11)
RDW: 13.6 % (ref 11.5–15.5)
WBC: 8.9 10*3/uL (ref 4.0–10.5)
nRBC: 0 % (ref 0.0–0.2)

## 2023-08-28 LAB — COMPREHENSIVE METABOLIC PANEL
ALT: 12 U/L (ref 0–44)
AST: 15 U/L (ref 15–41)
Albumin: 3.7 g/dL (ref 3.5–5.0)
Alkaline Phosphatase: 53 U/L (ref 38–126)
Anion gap: 8 (ref 5–15)
BUN: 12 mg/dL (ref 6–20)
CO2: 25 mmol/L (ref 22–32)
Calcium: 8.6 mg/dL — ABNORMAL LOW (ref 8.9–10.3)
Chloride: 106 mmol/L (ref 98–111)
Creatinine, Ser: 0.85 mg/dL (ref 0.44–1.00)
GFR, Estimated: >60 mL/min (ref >60)
Glucose, Bld: 123 mg/dL — ABNORMAL HIGH (ref 70–99)
Potassium: 3.3 mmol/L — ABNORMAL LOW (ref 3.5–5.1)
Sodium: 139 mmol/L (ref 135–145)
Total Bilirubin: 0.6 mg/dL (ref 0.3–1.2)
Total Protein: 7.1 g/dL (ref 6.5–8.1)

## 2023-08-28 LAB — SARS CORONAVIRUS 2 BY RT PCR: SARS Coronavirus 2 by RT PCR: NEGATIVE

## 2023-08-28 MED ORDER — CYCLOBENZAPRINE HCL 10 MG PO TABS: 10.0000 mg | ORAL_TABLET | Freq: Two times a day (BID) | ORAL | 0 refills | Status: AC | PRN

## 2023-08-28 MED ORDER — OXYCODONE-ACETAMINOPHEN 5-325 MG PO TABS
1.0000 | ORAL_TABLET | Freq: Once | ORAL | Status: DC
  Filled 2023-08-28: qty 1

## 2023-08-28 NOTE — Discharge Instructions (Addendum)
You were seen in the ER today for back pain. Your labs and imaging were unremarkable today. I have sent a prescription for Flexeril to your pharmacy for management of this plan. Please take this as prescribed. If your symptoms worsen, please return to the ER. Otherwise, follow up with your primary care provider.

## 2023-08-28 NOTE — ED Triage Notes (Signed)
Patient coming to ED for evaluation of mid back pain.  Reports symptoms started on Saturday.  No reports of injury or falls.  No numbness or tingling.  States she went to Urgent Care prior and had UA completed without findings.  Sent here for further evaluation.  Concerned that patient is having pain "on spine without hurting it."

## 2023-08-28 NOTE — ED Provider Notes (Signed)
[place] EM[facility]ENC[facility] AT [facility] Provider Note   CSN: [id number] Arrival date & time: 08/28/23  1935     History Chief Complaint  Patient presents with   Back Pain    Priscilla Russell is a 42 y.o. female.  Patient presents to the emergency department with concerns of back pain.  Reports this back pain is been present for approximately 3 days without improvement.  Endorse that the pain is in the midline lumbar spine without radiation into either lower extremity.  Denies any recent trauma, unintentional weight loss, neurological symptoms such as saddle paraesthesia, bowel or bladder incontinence, IV drug use, steroid use, fevers, or history of cancer.    Back Pain      Home Medications Prior to Admission medications   Medication Sig Start Date End Date Taking? Authorizing Provider  cyclobenzaprine (FLEXERIL) 10 MG tablet Take 1 tablet (10 mg total) by mouth 2 (two) times daily as needed for muscle spasms. 08/28/23  Yes Smitty Knudsen, PA-C  HYDROcodone bit-homatropine (HYCODAN) 5-1.5 MG/5ML syrup Take 5 mLs by mouth every 6 (six) hours as needed for cough. 08/06/23   Mardella Layman, MD      Allergies    Patient has no known allergies.    Review of Systems   Review of Systems  Musculoskeletal:  Positive for back pain.  All other systems reviewed and are negative.   Physical Exam Updated Vital Signs BP 117/73   Pulse 69   Temp 98.5 F (36.9 C) (Oral)   Resp 16   Ht 5\' 1"  (1.549 m)   Wt 71.2 kg   LMP 08/03/2023   SpO2 100%   BMI 29.66 kg/m  Physical Exam Vitals and nursing note reviewed.  Constitutional:      General: She is not in acute distress.    Appearance: She is well-developed.  HENT:     Head: Normocephalic and atraumatic.  Eyes:     Conjunctiva/sclera: Conjunctivae normal.  Cardiovascular:     Rate and Rhythm: Normal rate and regular rhythm.     Heart sounds: No murmur heard. Pulmonary:     Effort: Pulmonary effort is normal. No  respiratory distress.     Breath sounds: Normal breath sounds.  Abdominal:     Palpations: Abdomen is soft.     Tenderness: There is no abdominal tenderness.  Musculoskeletal:        General: Tenderness present. No swelling, deformity or signs of injury. Normal range of motion.       Arms:     Cervical back: Neck supple.     Comments: Low bad midline tenderness. Some paraspinal tenderness in lumbar region as well.  Skin:    General: Skin is warm and dry.     Capillary Refill: Capillary refill takes less than 2 seconds.  Neurological:     Mental Status: She is alert.  Psychiatric:        Mood and Affect: Mood normal.     ED Results / Procedures / Treatments   Labs (all labs ordered are listed, but only abnormal results are displayed) Labs Reviewed  COMPREHENSIVE METABOLIC PANEL - Abnormal; Notable for the following components:      Result Value   Potassium 3.3 (*)    Glucose, Bld 123 (*)    Calcium 8.6 (*)    All other components within normal limits  SARS CORONAVIRUS 2 BY RT PCR  CBC WITH DIFFERENTIAL/PLATELET  HCG, QUANTITATIVE, PREGNANCY    EKG None  Radiology DG Lumbar Spine Complete  Result Date: 08/28/2023 CLINICAL DATA:  Back pain without injury history. EXAM: LUMBAR SPINE - COMPLETE 4+ VIEW COMPARISON:  None Available. FINDINGS: There is no evidence of lumbar spine fracture. Alignment is normal. Intervertebral disc spaces are maintained. There are BTL clips in the pelvis. IMPRESSION: Negative. Electronically Signed   By: Almira Bar M.D.   On: 08/28/2023 23:38    Procedures Procedures   Medications Ordered in ED Medications  oxyCODONE-acetaminophen (PERCOCET/ROXICET) 5-325 MG per tablet 1 tablet (1 tablet Oral Patient Refused/Not Given 08/28/23 2225)    ED Course/ Medical Decision Making/ A&P                               Medical Decision Making Amount and/or Complexity of Data Reviewed Labs: ordered. Radiology: ordered.  Risk Prescription drug  management.   This patient presents to the ED for concern of back pain.  Differential diagnosis includes cauda equina syndrome, spinal abscess, lumbar strain, UTI   Lab Tests:  I Ordered, and personally interpreted labs.  The pertinent results include: CBC unremarkable, CMP with mild hypokalemia 3.3, quantitative hCG negative, COVID-19 negative, chart review shows unremarkable UA   Imaging Studies ordered:  I ordered imaging studies including xray lumbar spine  I independently visualized and interpreted imaging which showed no acute bony abnormality I agree with the radiologist interpretation   Problem List / ED Course:  Patient presented to the ED with concerns of back pain. Patient advised by urgent care to come in for assessment. Currently denies any recent trauma, unexplained weight loss, neurological symptoms such as saddle paraesthesia, bowel or bladder incontinence, age >50, fever, history of IVDU, steroid use, or history of any malignancy. No red flag findings of back pain. Labs and imaging ordered for evaluation and percocet ordered for pain control. Patient refused pain control as she has not had anything to eat. Labs without signs of leukocytosis, or severe electrolyte changes. No signs of renal impairment or injury. Hcg is negative and COVID-19 is also negative. Lumbar spine xray unremarkable. Low concern for any other acute abnormality. Given area of pain, discussion had of evaluation with advanced imaging but given lack of red flag findings, shared decision made to hold of on other imaging. Instead will trial course of anti-inflammatories at home along with muscle relaxer and close PCP follow up. Discussed strict return precautions. Low concern at this time for cauda equina syndrome given lack of clinical symptoms, abscess due to no recent fever or IVDU, or other acute concerns. Suspect this is likely muscular in nature. Patient discharged home in stable condition.  Final  Clinical Impression(s) / ED Diagnoses Final diagnoses:  Acute midline low back pain without sciatica    Rx / DC Orders ED Discharge Orders          Ordered    cyclobenzaprine (FLEXERIL) 10 MG tablet  2 times daily PRN        08/28/23 2350              Smitty Knudsen, PA-C 08/29/23 0001    Glyn Ade, MD 08/29/23 1502

## 2023-09-22 ENCOUNTER — Ambulatory Visit
Admission: RE | Admit: 2023-09-22 | Discharge: 2023-09-22 | Disposition: A | Discharge status: Home / Self Care | Payer: Commercial Managed Care - HMO | Source: Ambulatory Visit | Attending: Primary Care | Admitting: Primary Care

## 2023-09-22 ENCOUNTER — Other Ambulatory Visit: Payer: Self-pay | Admitting: Primary Care

## 2023-09-22 DIAGNOSIS — Z1231 Encounter for screening mammogram for malignant neoplasm of breast: Secondary | ICD-10-CM

## 2024-05-08 ENCOUNTER — Telehealth: Payer: Self-pay | Admitting: Physician Assistant

## 2024-05-08 DIAGNOSIS — R220 Localized swelling, mass and lump, head: Secondary | ICD-10-CM

## 2024-05-08 DIAGNOSIS — T63441A Toxic effect of venom of bees, accidental (unintentional), initial encounter: Secondary | ICD-10-CM

## 2024-05-08 MED ORDER — PREDNISONE 20 MG PO TABS: 40.0000 mg | ORAL_TABLET | Freq: Every day | ORAL | 0 refills | Status: AC

## 2024-05-08 NOTE — Progress Notes (Signed)
 E-Visit for Insect Sting  Thank you for describing the insect sting for us .  Here is how we plan to help!  Based on the information you have shared with me it looks like you have: A sting that we will treat with a short course of prednisone.  The 2 greatest risks from insect stings are allergic reaction, which can be fatal in some people and infection, which is more common and less serious.  Bees, wasps, yellow jackets, and hornets belong to a class of insects called Hymenoptera.  Most insect stings cause only minor discomfort.  Stings can happen anywhere on the body and can be painful.  Most stings are from honey bees or yellow jackets.  Fire ants can sting multiple times.  The sites of the stings are more likely to become infected.    Based on your information I have:, Provided a home care guide for insect stings and instructions on when to call for help., and I have sent in prednisone 40 mg by mouth daily for 5 days to the pharmacy you selected.  Please make sure that you selected a pharmacy that is open now.  What can be used to prevent Insect Stings?  Insect repellant with at least 20% DEET.  Wearing long pants and shirts with socks and shoes.  Wear dark or drab-colored clothes rather than bright colors.  Avoid using perfumes and hair sprays; these attract insects.  HOME CARE ADVICE:  1. Stinger removal: The stinger looks like a tiny black dot in the sting. Use a fingernail, credit card edge, or knife-edge to scrape it off.  Don't pull it out because it squeezes out more venom. If the stinger is below the skin surface, leave it alone.  It will be shed with normal skin healing. 2. Use cold compresses to the area of the sting for 10-20 minutes.  You may repeat this as needed to relieve symptoms of pain and swelling. 3.  For pain relief, take acetominophen 650 mg 4-6 hours as needed or ibuprofen  400 mg every 6-8 hours as needed or naproxen  250-500 mg every 12 hours as needed. 4.  You  can also use hydrocortisone cream 0.5% or 1% up to 4 times daily as needed for itching. 5.  If the sting becomes very itchy, take Benadryl 25-50 mg, follow directions on box. 6.  Wash the area 2-3 times daily with antibacterial soap and warm water. 7. Call your Doctor if: Fever, a severe headache, or rash occur in the next 2 weeks. Sting area begins to look infected. Redness and swelling worsens after home treatment. Your current symptoms become worse.    MAKE SURE YOU:  Understand these instructions. Will watch your condition. Will get help right away if you are not doing well or get worse.  Thank you for choosing an e-visit.  Your e-visit answers were reviewed by a board certified advanced clinical practitioner to complete your personal care plan. Depending upon the condition, your plan could have included both over the counter or prescription medications.  Please review your pharmacy choice. Make sure the pharmacy is open so you can pick up prescription now. If there is a problem, you may contact your provider through [employer] and have the prescription routed to another pharmacy.  Your safety is important to us . If you have drug allergies check your prescription carefully.   For the next 24 hours you can use MyChart to ask questions about today's visit, request a non-urgent call back, or ask  for a work or school excuse. You will get an email in the next two days asking about your experience. I hope that your e-visit has been valuable and will speed your recovery.    I have spent 5 minutes in review of e-visit questionnaire, review and updating patient chart, medical decision making and response to patient.   Angelia Kelp, PA-C

## 2024-09-07 ENCOUNTER — Other Ambulatory Visit: Payer: Self-pay

## 2024-09-07 ENCOUNTER — Emergency Department (HOSPITAL_COMMUNITY)
Admission: EM | Admit: 2024-09-07 | Discharge: 2024-09-08 | Discharge status: Against Medical Advice | Payer: Self-pay | Attending: Emergency Medicine | Admitting: Emergency Medicine

## 2024-09-07 ENCOUNTER — Encounter (HOSPITAL_COMMUNITY): Payer: Self-pay

## 2024-09-07 DIAGNOSIS — R112 Nausea with vomiting, unspecified: Secondary | ICD-10-CM | POA: Insufficient documentation

## 2024-09-07 DIAGNOSIS — R197 Diarrhea, unspecified: Secondary | ICD-10-CM | POA: Insufficient documentation

## 2024-09-07 DIAGNOSIS — R1033 Periumbilical pain: Secondary | ICD-10-CM | POA: Insufficient documentation

## 2024-09-07 DIAGNOSIS — E860 Dehydration: Secondary | ICD-10-CM | POA: Insufficient documentation

## 2024-09-07 DIAGNOSIS — Z5321 Procedure and treatment not carried out due to patient leaving prior to being seen by health care provider: Secondary | ICD-10-CM | POA: Insufficient documentation

## 2024-09-07 LAB — COMPREHENSIVE METABOLIC PANEL WITH GFR
ALT: 14 U/L (ref 0–44)
AST: 19 U/L (ref 15–41)
Albumin: 3.8 g/dL (ref 3.5–5.0)
Alkaline Phosphatase: 57 U/L (ref 38–126)
Anion gap: 12 (ref 5–15)
BUN: 10 mg/dL (ref 6–20)
CO2: 23 mmol/L (ref 22–32)
Calcium: 8.9 mg/dL (ref 8.9–10.3)
Chloride: 99 mmol/L (ref 98–111)
Creatinine, Ser: 0.81 mg/dL (ref 0.44–1.00)
GFR, Estimated: >60 mL/min (ref >60)
Glucose, Bld: 103 mg/dL — ABNORMAL HIGH (ref 70–99)
Potassium: 3.3 mmol/L — ABNORMAL LOW (ref 3.5–5.1)
Sodium: 134 mmol/L — ABNORMAL LOW (ref 135–145)
Total Bilirubin: 1.6 mg/dL — ABNORMAL HIGH (ref 0.0–1.2)
Total Protein: 7 g/dL (ref 6.5–8.1)

## 2024-09-07 LAB — CBC WITH DIFFERENTIAL/PLATELET
Abs Immature Granulocytes: 0.02 K/uL (ref 0.00–0.07)
Basophils Absolute: 0 K/uL (ref 0.0–0.1)
Basophils Relative: 0 %
Eosinophils Absolute: 0 K/uL (ref 0.0–0.5)
Eosinophils Relative: 0 %
HCT: 38.8 % (ref 36.0–46.0)
Hemoglobin: 12.8 g/dL (ref 12.0–15.0)
Immature Granulocytes: 0 %
Lymphocytes Relative: 18 %
Lymphs Abs: 2 K/uL (ref 0.7–4.0)
MCH: 27.1 pg (ref 26.0–34.0)
MCHC: 33 g/dL (ref 30.0–36.0)
MCV: 82.2 fL (ref 80.0–100.0)
Monocytes Absolute: 0.8 K/uL (ref 0.1–1.0)
Monocytes Relative: 7 %
Neutro Abs: 8.3 K/uL — ABNORMAL HIGH (ref 1.7–7.7)
Neutrophils Relative %: 75 %
Platelets: 256 K/uL (ref 150–400)
RBC: 4.72 MIL/uL (ref 3.87–5.11)
RDW: 13.4 % (ref 11.5–15.5)
WBC: 11.1 K/uL — ABNORMAL HIGH (ref 4.0–10.5)
nRBC: 0 % (ref 0.0–0.2)

## 2024-09-07 LAB — RESP PANEL BY RT-PCR (RSV, FLU A&B, COVID)  RVPGX2
Influenza A by PCR: NEGATIVE
Influenza B by PCR: NEGATIVE
Resp Syncytial Virus by PCR: NEGATIVE
SARS Coronavirus 2 by RT PCR: NEGATIVE

## 2024-09-07 LAB — LIPASE, BLOOD: Lipase: 62 U/L — ABNORMAL HIGH (ref 11–51)

## 2024-09-07 LAB — HCG, SERUM, QUALITATIVE: Preg, Serum: NEGATIVE

## 2024-09-07 MED ORDER — ONDANSETRON 4 MG PO TBDP
4.0000 mg | ORAL_TABLET | Freq: Once | ORAL | Status: AC
  Administered 2024-09-07: 4 mg via ORAL
  Filled 2024-09-07: qty 1

## 2024-09-07 NOTE — ED Notes (Signed)
 Pt unable to urinate at this time. Given collection cup and will try later

## 2024-09-07 NOTE — ED Triage Notes (Signed)
 Pt states that she has had diffuse abdominal pain with nausea, vomiting and diarrhea x 2 days. Last vomited just prior to arrival. Pt is concerned that she is dehydrated.

## 2024-09-07 NOTE — ED Provider Triage Note (Signed)
 Emergency Medicine Provider Triage Evaluation Note  Priscilla Russell , a 43 y.o. female  was evaluated in triage.  Pt complains of abdominal pain, nausea, vomiting. Sx began Friday.  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  Reports she has felt feverish but has not taken her temperature.  She is concerned she is dehydrated as she is having difficulty tolerating p.o. intake without nausea or vomiting.  Does endorse some pain, reports that is in her periumbilical region.  Denies history of similar symptoms previously.  No diarrhea.  History of C-sections, no other history of abdominal surgeries.  Review of Systems  Positive:  Negative:   Physical Exam  BP 115/69 (BP Location: Right Arm)   Pulse 63   Temp 98 F (36.7 C)   Resp 16   LMP 08/25/2024 (Exact Date)   SpO2 98%  Gen:   Awake, no distress   Resp:  Normal effort  MSK:   Moves extremities without difficulty  Other:    Medical Decision Making  Medically screening exam initiated at 7:47 PM.  Appropriate orders placed.  Priscilla Russell was informed that the remainder of the evaluation will be completed by another provider, this initial triage assessment does not replace that evaluation, and the importance of remaining in the ED until their evaluation is complete.     Nora Lauraine LABOR, PA-C 09/07/24 1948

## 2024-09-07 NOTE — ED Notes (Signed)
 Pt uable to obtain urine specimen.

## 2024-09-08 NOTE — ED Notes (Signed)
 Pt stated she wanted to leave due to long wait, and will look at results on my chart.
